# Patient Record
Sex: Male | Born: 1975 | Race: Black or African American | Hispanic: No | Marital: Single | Smoking: Former smoker
Health system: Southern US, Community
[De-identification: ages and names within clinical notes are randomized; demographics above are authoritative.]

## PROBLEM LIST (undated history)

## (undated) DIAGNOSIS — I10 Essential (primary) hypertension: Secondary | ICD-10-CM

## (undated) DIAGNOSIS — F909 Attention-deficit hyperactivity disorder, unspecified type: Secondary | ICD-10-CM

## (undated) DIAGNOSIS — K219 Gastro-esophageal reflux disease without esophagitis: Secondary | ICD-10-CM

## (undated) DIAGNOSIS — F419 Anxiety disorder, unspecified: Secondary | ICD-10-CM

## (undated) DIAGNOSIS — M1990 Unspecified osteoarthritis, unspecified site: Secondary | ICD-10-CM

## (undated) DIAGNOSIS — R519 Headache, unspecified: Secondary | ICD-10-CM

## (undated) HISTORY — PX: TONSILLECTOMY: SUR1361

## (undated) HISTORY — PX: COLONOSCOPY: SHX174

---
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
## 1998-02-25 ENCOUNTER — Emergency Department (HOSPITAL_COMMUNITY): Admission: EM | Admit: 1998-02-25 | Discharge: 1998-02-25 | Payer: Self-pay | Admitting: Internal Medicine

## 2003-06-25 ENCOUNTER — Inpatient Hospital Stay (HOSPITAL_COMMUNITY): Admission: EM | Admit: 2003-06-25 | Discharge: 2003-06-26 | Payer: Self-pay

## 2008-08-04 ENCOUNTER — Emergency Department (HOSPITAL_COMMUNITY): Admission: EM | Admit: 2008-08-04 | Discharge: 2008-08-04 | Payer: Self-pay | Admitting: Emergency Medicine

## 2009-11-08 ENCOUNTER — Emergency Department (HOSPITAL_COMMUNITY): Admission: EM | Admit: 2009-11-08 | Discharge: 2009-11-08 | Payer: Self-pay | Admitting: Emergency Medicine
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
## 2011-06-11 ENCOUNTER — Emergency Department (HOSPITAL_COMMUNITY): Payer: Medicaid Other

## 2011-06-11 ENCOUNTER — Emergency Department (HOSPITAL_COMMUNITY)
Admission: EM | Admit: 2011-06-11 | Discharge: 2011-06-12 | Disposition: A | Discharge status: Home / Self Care | Payer: Medicaid Other | Attending: Emergency Medicine | Admitting: Emergency Medicine

## 2011-06-11 DIAGNOSIS — R109 Unspecified abdominal pain: Secondary | ICD-10-CM | POA: Insufficient documentation

## 2011-06-11 DIAGNOSIS — N453 Epididymo-orchitis: Secondary | ICD-10-CM | POA: Insufficient documentation

## 2011-06-11 DIAGNOSIS — M545 Low back pain, unspecified: Secondary | ICD-10-CM | POA: Insufficient documentation

## 2011-06-11 LAB — BASIC METABOLIC PANEL
BUN: 8 mg/dL (ref 6–23)
CO2: 24 mEq/L (ref 19–32)
Calcium: 9.1 mg/dL (ref 8.4–10.5)
Chloride: 98 mEq/L (ref 96–112)
Creatinine, Ser: 0.73 mg/dL (ref 0.50–1.35)
GFR calc Af Amer: >90 mL/min (ref >90)
GFR calc non Af Amer: >90 mL/min (ref >90)
Glucose, Bld: 113 mg/dL — ABNORMAL HIGH (ref 70–99)
Potassium: 3.1 mEq/L — ABNORMAL LOW (ref 3.5–5.1)
Sodium: 135 mEq/L (ref 135–145)

## 2011-06-11 LAB — CBC
HCT: 40 % (ref 39.0–52.0)
Hemoglobin: 13.6 g/dL (ref 13.0–17.0)
MCH: 29.4 pg (ref 26.0–34.0)
MCHC: 34 g/dL (ref 30.0–36.0)
MCV: 86.4 fL (ref 78.0–100.0)
Platelets: 214 10*3/uL (ref 150–400)
RBC: 4.63 MIL/uL (ref 4.22–5.81)
RDW: 13.9 % (ref 11.5–15.5)
WBC: 19.7 10*3/uL — ABNORMAL HIGH (ref 4.0–10.5)

## 2011-06-11 LAB — DIFFERENTIAL
Basophils Absolute: 0 10*3/uL (ref 0.0–0.1)
Basophils Relative: 0 % (ref 0–1)
Eosinophils Absolute: 0.1 10*3/uL (ref 0.0–0.7)
Eosinophils Relative: 1 % (ref 0–5)
Lymphocytes Relative: 12 % (ref 12–46)
Lymphs Abs: 2.4 10*3/uL (ref 0.7–4.0)
Monocytes Absolute: 1.7 10*3/uL — ABNORMAL HIGH (ref 0.1–1.0)
Monocytes Relative: 9 % (ref 3–12)
Neutro Abs: 15.5 10*3/uL — ABNORMAL HIGH (ref 1.7–7.7)
Neutrophils Relative %: 79 % — ABNORMAL HIGH (ref 43–77)

## 2011-06-12 LAB — URINALYSIS, ROUTINE W REFLEX MICROSCOPIC
Bilirubin Urine: NEGATIVE
Glucose, UA: NEGATIVE mg/dL
Ketones, ur: 15 mg/dL — AB
Nitrite: NEGATIVE
Protein, ur: NEGATIVE mg/dL
Specific Gravity, Urine: 1.006 (ref 1.005–1.030)
Urobilinogen, UA: 0.2 mg/dL (ref 0.0–1.0)
pH: 6 (ref 5.0–8.0)

## 2011-06-12 LAB — URINE MICROSCOPIC-ADD ON

## 2013-02-10 ENCOUNTER — Emergency Department (HOSPITAL_COMMUNITY): Payer: Self-pay

## 2013-02-10 ENCOUNTER — Emergency Department (HOSPITAL_COMMUNITY)
Admission: EM | Admit: 2013-02-10 | Discharge: 2013-02-10 | Disposition: A | Discharge status: Home / Self Care | Payer: Self-pay | Attending: Emergency Medicine | Admitting: Emergency Medicine

## 2013-02-10 ENCOUNTER — Encounter (HOSPITAL_COMMUNITY): Payer: Self-pay | Admitting: *Deleted

## 2013-02-10 DIAGNOSIS — Y9389 Activity, other specified: Secondary | ICD-10-CM | POA: Insufficient documentation

## 2013-02-10 DIAGNOSIS — F17200 Nicotine dependence, unspecified, uncomplicated: Secondary | ICD-10-CM | POA: Insufficient documentation

## 2013-02-10 DIAGNOSIS — S139XXA Sprain of joints and ligaments of unspecified parts of neck, initial encounter: Secondary | ICD-10-CM | POA: Insufficient documentation

## 2013-02-10 DIAGNOSIS — S239XXA Sprain of unspecified parts of thorax, initial encounter: Secondary | ICD-10-CM | POA: Insufficient documentation

## 2013-02-10 DIAGNOSIS — Z880 Allergy status to penicillin: Secondary | ICD-10-CM | POA: Insufficient documentation

## 2013-02-10 DIAGNOSIS — Y92410 Unspecified street and highway as the place of occurrence of the external cause: Secondary | ICD-10-CM | POA: Insufficient documentation

## 2013-02-10 MED ORDER — TRAMADOL HCL 50 MG PO TABS: 50.0000 mg | ORAL_TABLET | Freq: Four times a day (QID) | ORAL | Status: DC | PRN

## 2013-02-10 MED ORDER — IBUPROFEN 600 MG PO TABS: 600.0000 mg | ORAL_TABLET | Freq: Four times a day (QID) | ORAL | Status: DC | PRN
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
## 2013-08-19 DIAGNOSIS — A159 Respiratory tuberculosis unspecified: Secondary | ICD-10-CM

## 2013-08-19 HISTORY — DX: Respiratory tuberculosis unspecified: A15.9

## 2013-09-25 ENCOUNTER — Encounter (HOSPITAL_COMMUNITY): Payer: Self-pay | Admitting: Emergency Medicine

## 2013-09-25 ENCOUNTER — Emergency Department (HOSPITAL_COMMUNITY)
Admission: EM | Admit: 2013-09-25 | Discharge: 2013-09-25 | Disposition: A | Discharge status: Home / Self Care | Payer: No Typology Code available for payment source | Attending: Emergency Medicine | Admitting: Emergency Medicine

## 2013-09-25 DIAGNOSIS — Z880 Allergy status to penicillin: Secondary | ICD-10-CM | POA: Insufficient documentation

## 2013-09-25 DIAGNOSIS — H209 Unspecified iridocyclitis: Secondary | ICD-10-CM | POA: Insufficient documentation

## 2013-09-25 DIAGNOSIS — F17200 Nicotine dependence, unspecified, uncomplicated: Secondary | ICD-10-CM | POA: Insufficient documentation

## 2013-09-25 MED ORDER — OXYCODONE-ACETAMINOPHEN 5-325 MG PO TABS: 1.0000 | ORAL_TABLET | ORAL | Status: DC | PRN

## 2013-09-25 MED ORDER — FLUORESCEIN SODIUM 1 MG OP STRP
1.0000 | ORAL_STRIP | Freq: Once | OPHTHALMIC | Status: AC
  Administered 2013-09-25: 1 via OPHTHALMIC
  Filled 2013-09-25: qty 1

## 2013-09-25 MED ORDER — TETRACAINE HCL 0.5 % OP SOLN
2.0000 [drp] | Freq: Once | OPHTHALMIC | Status: AC
  Administered 2013-09-25: 2 [drp] via OPHTHALMIC
  Filled 2013-09-25: qty 2

## 2013-09-25 MED ORDER — IBUPROFEN 800 MG PO TABS: 800.0000 mg | ORAL_TABLET | Freq: Three times a day (TID) | ORAL | Status: DC

## 2013-09-25 MED ORDER — OXYCODONE-ACETAMINOPHEN 5-325 MG PO TABS
1.0000 | ORAL_TABLET | Freq: Once | ORAL | Status: AC
  Administered 2013-09-25: 1 via ORAL
  Filled 2013-09-25: qty 1

## 2013-09-25 MED ORDER — TOBRAMYCIN-DEXAMETHASONE 0.3-0.1 % OP SUSP
2.0000 [drp] | Freq: Four times a day (QID) | OPHTHALMIC | Status: DC
  Administered 2013-09-25: 2 [drp] via OPHTHALMIC
  Filled 2013-09-25: qty 2.5

## 2013-09-25 NOTE — ED Notes (Signed)
Woke this am with R eye pain and burning, denies any injuries to his eye, tried eye drops and clear eyes with no relief.

## 2013-09-25 NOTE — Discharge Instructions (Signed)
Take ibuprofen for pain. Percocet for severe pain. Apply tobradex drops, 2 drops every 6 hrs for 4 days. If not improving by next week follow up with Dr. Allyne GeeSanders.    Iritis Iritis is an inflammation of the colored part of the eye (iris). Other parts at the front of the eye may also be inflamed. The iris is part of the middle layer of the eyeball which is called the uvea or the uveal track. Any part of the uveal track can become inflamed. The other portions of the uveal track are the choroid (the thin membrane under the outer layer of the eye), and the ciliary body (joins the choroid and the iris and produces the fluid in the front of the eye).  It is extremely important to treat iritis early, as it may lead to internal eye damage causing scarring or diseases such as glaucoma. Some people have only one attack of iritis (in one or both eyes) in their lifetime, while others may get it many times. CAUSES Iritis can be associated with many different diseases, but mostly occurs in otherwise healthy people. Examples of diseases that can be associated with iritis include:  Diseases where the body's immune system attacks tissues within your own body (autoimmune diseases).  Infections (tuberculosis, gonorrhea, fungus infections, Lyme disease, infection of the lining of the heart).  Trauma or injury.  Eye diseases (acute glaucoma and others).  Inflammation from other parts of the uveal track.  Severe eye infections.  Other rare diseases. SYMPTOMS  Eye pain or aching.  Sensitivity to light.  Loss of sight or blurred vision.  Redness of the eye. This is often accompanied by a ring of redness around the outside of the cornea, or clear covering at the front of the eye (ciliary flush).  Excessive tearing of the eye(s).  A small pupil that does not enlarge in the dark and stays smaller than the other eye's pupil.  A whitish area that obscures the lower part of the colored circular iris. Sometimes  this is visible when looking at the eye, where the whitish area has a "fluid level" or flat top. This is called a "hypopyon" and is actually pus inside the eye. Since iritis causes the eye to become red, it is often confused with a much less dangerous form of "pink eye" or conjunctivitis. One of the most important symptoms is sensitivity to light. Anytime there is redness, discomfort in the eye(s) and extreme light sensitivity, it is extremely important to see an ophthalmologist as soon as possible. TREATMENT Acute iritis requires prompt medical evaluation by an eye specialist (ophthalmologist.) Treatment depends on the underlying cause but may include:  Corticosteroid eye drops and dilating eye drops. Follow your caregiver's exact instructions on taking and stopping corticosteroid medications (drops or pills).  Occasionally, the iritis will be so severe that it will not respond to commonly used medications. If this happens, it may be necessary to use steroid injections. The injections are given under the eye's outer surface. Sometimes oral medications are given. The decision on treatment used for iritis is usually made on an individual basis. HOME CARE INSTRUCTIONS Your care giver will give specific instructions regarding the use of eye medications or other medications. Be certain to follow all instructions in both taking and stopping the medications. SEEK IMMEDIATE MEDICAL CARE IF:  You have redness of one or both eye.  You experience a great deal of light sensitivity.  You have pain or aching in either eye. MAKE SURE  YOU:   Understand these instructions.  Will watch your condition.  Will get help right away if you are not doing well or get worse. Document Released: 08/05/2005 Document Revised: 10/28/2011 Document Reviewed: 01/23/2007 [facility] Patient Information 2014 [place], [place].

## 2013-09-25 NOTE — ED Provider Notes (Signed)
CSN: [id number]     Arrival date & time 09/25/13  1332 History  This chart was scribed for Jaynie Crumble, PA working with Flint Melter, MD by Quintella Reichert, ED Scribe. This patient was seen in room TR04C/TR04C and the patient's care was started at 2:32 PM.   Chief Complaint  Patient presents with  . Eye Pain    The history is provided by the patient. No language interpreter was used.    HPI Comments: [name] is a 38 y.o. male who presents to the Emergency Department complaining of severe burning right eye pain that began on waking this morning.  Pt also reports associated blurred vision in that eye.  He has been holding a cold rag on the eye.  He denies previous h/o eye issues.  He does not wear contacts or glasses.  He works at a [employer].   History reviewed. No pertinent past medical history.  History reviewed. No pertinent past surgical history.  History reviewed. No pertinent family history.   History  Substance Use Topics  . Smoking status: Current Every Day Smoker -- 1.00 packs/day    Types: Cigarettes  . Smokeless tobacco: Not on file  . Alcohol Use: Yes     Review of Systems  Constitutional: Negative for fever.  Eyes: Positive for pain and visual disturbance.     Allergies  Codeine and Penicillins  Home Medications   Current Outpatient Rx  Name  Route  Sig  Dispense  Refill  . ibuprofen (ADVIL,MOTRIN) 600 MG tablet   Oral   Take 1 tablet (600 mg total) by mouth every 6 (six) hours as needed for pain.   30 tablet   0   . traMADol (ULTRAM) 50 MG tablet   Oral   Take 1 tablet (50 mg total) by mouth every 6 (six) hours as needed for pain.   15 tablet   0    BP 157/96  Pulse 93  Temp(Src) 97.9 F (36.6 C) (Oral)  Resp 18  SpO2 96%  Physical Exam  Nursing note and vitals reviewed. Constitutional: He is oriented to person, place, and time. He appears well-developed and well-nourished. No distress.  HENT:  Head:  Normocephalic and atraumatic.  Eyes: EOM are normal. Pupils are equal, round, and reactive to light. Right eye exhibits no discharge, no exudate and no hordeolum. No foreign body present in the right eye. Left eye exhibits no discharge, no exudate and no hordeolum. No foreign body present in the left eye. Right conjunctiva is injected. Right conjunctiva has no hemorrhage. Left conjunctiva is not injected. Left conjunctiva has no hemorrhage.  Slit lamp exam:      The right eye shows no corneal abrasion, no corneal ulcer, no foreign body, no hyphema, no hypopyon and no fluorescein uptake.       The left eye shows no corneal abrasion, no corneal ulcer, no foreign body, no hyphema and no hypopyon.  Right conjunctiva injected.   Neck: Neck supple. No tracheal deviation present.  Cardiovascular: Normal rate.   Pulmonary/Chest: Effort normal. No respiratory distress.  Musculoskeletal: Normal range of motion.  Neurological: He is alert and oriented to person, place, and time.  Skin: Skin is warm and dry.  Psychiatric: He has a normal mood and affect. His behavior is normal.    ED Course  Procedures (including critical care time)  DIAGNOSTIC STUDIES: Oxygen Saturation is 96% on room air, normal by my interpretation.    COORDINATION OF CARE: 2:35  PM-Discussed treatment plan which includes fluorescein exam with pt at bedside and pt agreed to plan.     Labs Review Labs Reviewed - No data to display  Imaging Review No results found.  EKG Interpretation   None       MDM   1. Iritis     Visual acuity L 20/20, R 20/100. Pressures 16. Pt is pacing in the room in pain. PERRLA, right eye conjunctiva is injected. Photophobia in right eye only, no consensual photophobia. Fluorescein stain negative. Pressure normal. No hyphema or hypopyon. Will get opthalmology involved given decrease in vision and pain.    3:31 PM Discussed with Dr. Allyne GeeSanders, ophthalmology. Advised tobradex drops QID, follow  up with him next week if not improving.   Filed Vitals:   09/25/13 1339  BP: 157/96  Pulse: 93  Temp: 97.9 F (36.6 C)  TempSrc: Oral  Resp: 18  SpO2: 96%     I personally performed the services described in this documentation, which was scribed in my presence. The recorded information has been reviewed and is accurate.    Lottie Musselatyana A Kelvis Berger, PA-C 09/25/13 1538

## 2013-09-25 NOTE — ED Provider Notes (Signed)
Medical screening examination/treatment/procedure(s) were performed by non-physician practitioner and as supervising physician I was immediately available for consultation/collaboration.  Spero Gunnels L Romelle Muldoon, MD 09/25/13 1604 

## 2014-04-16 ENCOUNTER — Emergency Department (HOSPITAL_COMMUNITY)
Admission: EM | Admit: 2014-04-16 | Discharge: 2014-04-16 | Disposition: A | Discharge status: Home / Self Care | Payer: Medicaid Other | Attending: Emergency Medicine | Admitting: Emergency Medicine

## 2014-04-16 ENCOUNTER — Encounter (HOSPITAL_COMMUNITY): Payer: Self-pay | Admitting: Emergency Medicine

## 2014-04-16 ENCOUNTER — Emergency Department (HOSPITAL_COMMUNITY): Payer: Medicaid Other

## 2014-04-16 DIAGNOSIS — Z79899 Other long term (current) drug therapy: Secondary | ICD-10-CM | POA: Insufficient documentation

## 2014-04-16 DIAGNOSIS — S62309A Unspecified fracture of unspecified metacarpal bone, initial encounter for closed fracture: Secondary | ICD-10-CM | POA: Insufficient documentation

## 2014-04-16 DIAGNOSIS — F17200 Nicotine dependence, unspecified, uncomplicated: Secondary | ICD-10-CM | POA: Insufficient documentation

## 2014-04-16 DIAGNOSIS — Z880 Allergy status to penicillin: Secondary | ICD-10-CM | POA: Insufficient documentation

## 2014-04-16 DIAGNOSIS — S6292XA Unspecified fracture of left wrist and hand, initial encounter for closed fracture: Secondary | ICD-10-CM

## 2014-04-16 DIAGNOSIS — S6990XA Unspecified injury of unspecified wrist, hand and finger(s), initial encounter: Secondary | ICD-10-CM | POA: Insufficient documentation

## 2014-04-16 MED ORDER — NAPROXEN 500 MG PO TABS: 500.0000 mg | ORAL_TABLET | Freq: Two times a day (BID) | ORAL | Status: DC

## 2014-04-16 MED ORDER — HYDROCODONE-ACETAMINOPHEN 5-325 MG PO TABS: 1.0000 | ORAL_TABLET | ORAL | Status: DC | PRN

## 2014-04-16 MED ORDER — HYDROCODONE-ACETAMINOPHEN 5-325 MG PO TABS
2.0000 | ORAL_TABLET | Freq: Once | ORAL | Status: DC
  Filled 2014-04-16: qty 2

## 2014-04-16 MED ORDER — HYDROCODONE-ACETAMINOPHEN 5-325 MG PO TABS
2.0000 | ORAL_TABLET | Freq: Once | ORAL | Status: AC
Start: 2014-04-16 — End: 2014-04-16
  Administered 2014-04-16: 2 via ORAL
  Filled 2014-04-16: qty 2
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
## 2014-04-16 NOTE — Discharge Instructions (Signed)
Your X-rays show that you have broken bones from your injury. Use rest, ice, compression elevation to reduce pain and swelling in your hand. Followup with a primary care provider or to orthopedic specialist for continued evaluation and treatment.    Hand Fracture, Metacarpals Fractures of metacarpals are breaks in the bones of the hand. They extend from the knuckles to the wrist. These bones can undergo many types of fractures. There are different ways of treating these fractures, all of which may be correct. TREATMENT  Hand fractures can be treated with:   Non-reduction - The fracture is casted without changing the positions of the fracture (bone pieces) involved. This fracture is usually left in a cast for 4 to 6 weeks or as your caregiver thinks necessary.  Closed reduction - The bones are moved back into position without surgery and then casted.  ORIF (open reduction and internal fixation) - The fracture site is opened and the bone pieces are fixed into place with some type of hardware, such as screws, etc. They are then casted. Your caregiver will discuss the type of fracture you have and the treatment that should be best for that problem. If surgery is chosen, let your caregivers know about the following.  LET YOUR CAREGIVERS KNOW ABOUT:  Allergies.  Medications you are taking, including herbs, eye drops, over the counter medications, and creams.  Use of steroids (by mouth or creams).  Previous problems with anesthetics or novocaine.  Possibility of pregnancy.  History of blood clots (thrombophlebitis).  History of bleeding or blood problems.  Previous surgeries.  Other health problems. AFTER THE PROCEDURE After surgery, you will be taken to the recovery area where a nurse will watch and check your progress. Once you are awake, stable, and taking fluids well, barring other problems, you'll be allowed to go home. Once home, an ice pack applied to your operative site may help  with pain and keep the swelling down. HOME CARE INSTRUCTIONS   Follow your caregiver's instructions as to activities, exercises, physical therapy, and driving a car.  Daily exercise is helpful for keeping range of motion and strength. Exercise as instructed.  To lessen swelling, keep the injured hand elevated above the level of your heart as much as possible.  Apply ice to the injury for 15-20 minutes each hour while awake for the first 2 days. Put the ice in a plastic bag and place a thin towel between the bag of ice and your cast.  Move the fingers of your casted hand several times a day.  If a plaster or fiberglass cast was applied:  Do not try to scratch the skin under the cast using a sharp or pointed object.  Check the skin around the cast every day. You may put lotion on red or sore areas.  Keep your cast dry. Your cast can be protected during bathing with a plastic bag. Do not put your cast into the water.  If a plaster splint was applied:  Wear your splint for as long as directed by your caregiver or until seen again.  Do not get your splint wet. Protect it during bathing with a plastic bag.  You may loosen the elastic bandage around the splint if your fingers start to get numb, tingle, get cold or turn blue.  Do not put pressure on your cast or splint; this may cause it to break. Especially, do not lean plaster casts on hard surfaces for 24 hours after application.  Take medications as  directed by your caregiver.  Only take over-the-counter or prescription medicines for pain, discomfort, or fever as directed by your caregiver.  Follow-up as provided by your caregiver. This is very important in order to avoid permanent injury or disability and chronic pain. SEEK MEDICAL CARE IF:   Increased bleeding (more than a small spot) from beneath your cast or splint if there is beneath the cast as with an open reduction.  Redness, swelling, or increasing pain in the wound or  from beneath your cast or splint.  Pus coming from wound or from beneath your cast or splint.  An unexplained oral temperature above 102 F (38.9 C) develops, or as your caregiver suggests.  A foul smell coming from the wound or dressing or from beneath your cast or splint.  You have a problem moving any of your fingers. SEEK IMMEDIATE MEDICAL CARE IF:   You develop a rash  You have difficulty breathing  You have any allergy problems If you do not have a window in your cast for observing the wound, a discharge or minor bleeding may show up as a stain on the outside of your cast. Report these findings to your caregiver. MAKE SURE YOU:   Understand these instructions.  Will watch your condition.  Will get help right away if you are not doing well or get worse. Document Released: 08/05/2005 Document Revised: 10/28/2011 Document Reviewed: 03/24/2008 [facility] Patient Information 2015 [place], [place]. This information is not intended to replace advice given to you by your health care provider. Make sure you discuss any questions you have with your health care provider.
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
## 2014-04-25 NOTE — ED Provider Notes (Signed)
Medical screening examination/treatment/procedure(s) were performed by non-physician practitioner and as supervising physician I was immediately available for consultation/collaboration.   EKG Interpretation None       [name]. Rubin Payor, MD 04/25/14 1441

## 2014-10-18 ENCOUNTER — Encounter (HOSPITAL_COMMUNITY): Payer: Self-pay | Admitting: Emergency Medicine

## 2014-10-18 ENCOUNTER — Emergency Department (HOSPITAL_COMMUNITY)
Admission: EM | Admit: 2014-10-18 | Discharge: 2014-10-18 | Disposition: A | Discharge status: Home / Self Care | Payer: Medicaid Other | Attending: Emergency Medicine | Admitting: Emergency Medicine

## 2014-10-18 ENCOUNTER — Emergency Department (HOSPITAL_COMMUNITY): Payer: Medicaid Other

## 2014-10-18 DIAGNOSIS — Z880 Allergy status to penicillin: Secondary | ICD-10-CM | POA: Insufficient documentation

## 2014-10-18 DIAGNOSIS — W2201XA Walked into wall, initial encounter: Secondary | ICD-10-CM | POA: Insufficient documentation

## 2014-10-18 DIAGNOSIS — S62306A Unspecified fracture of fifth metacarpal bone, right hand, initial encounter for closed fracture: Secondary | ICD-10-CM | POA: Insufficient documentation

## 2014-10-18 DIAGNOSIS — Z791 Long term (current) use of non-steroidal anti-inflammatories (NSAID): Secondary | ICD-10-CM | POA: Insufficient documentation

## 2014-10-18 DIAGNOSIS — S60041A Contusion of right ring finger without damage to nail, initial encounter: Secondary | ICD-10-CM | POA: Insufficient documentation

## 2014-10-18 DIAGNOSIS — Y9389 Activity, other specified: Secondary | ICD-10-CM | POA: Insufficient documentation

## 2014-10-18 DIAGNOSIS — Z720 Tobacco use: Secondary | ICD-10-CM | POA: Insufficient documentation

## 2014-10-18 DIAGNOSIS — Y998 Other external cause status: Secondary | ICD-10-CM | POA: Insufficient documentation

## 2014-10-18 DIAGNOSIS — Y9289 Other specified places as the place of occurrence of the external cause: Secondary | ICD-10-CM | POA: Insufficient documentation

## 2014-10-18 DIAGNOSIS — Z23 Encounter for immunization: Secondary | ICD-10-CM | POA: Insufficient documentation

## 2014-10-18 DIAGNOSIS — S6291XA Unspecified fracture of right wrist and hand, initial encounter for closed fracture: Secondary | ICD-10-CM

## 2014-10-18 MED ORDER — TETANUS-DIPHTH-ACELL PERTUSSIS 5-2.5-18.5 LF-MCG/0.5 IM SUSP
0.5000 mL | Freq: Once | INTRAMUSCULAR | Status: AC
  Administered 2014-10-18: 0.5 mL via INTRAMUSCULAR
  Filled 2014-10-18: qty 0.5

## 2014-10-18 MED ORDER — LIDOCAINE HCL (PF) 1 % IJ SOLN
30.0000 mL | Freq: Once | INTRAMUSCULAR | Status: DC
  Filled 2014-10-18: qty 30

## 2014-10-18 MED ORDER — OXYCODONE-ACETAMINOPHEN 5-325 MG PO TABS: 1.0000 | ORAL_TABLET | Freq: Four times a day (QID) | ORAL | Status: DC | PRN

## 2014-10-18 MED ORDER — KETOROLAC TROMETHAMINE 30 MG/ML IJ SOLN
30.0000 mg | Freq: Once | INTRAMUSCULAR | Status: AC
  Administered 2014-10-18: 30 mg via INTRAMUSCULAR
  Filled 2014-10-18: qty 1

## 2014-10-18 MED ORDER — KETOROLAC TROMETHAMINE 30 MG/ML IJ SOLN: 30.0000 mg | Freq: Once | INTRAMUSCULAR | Status: DC

## 2014-10-18 MED ORDER — HYDROMORPHONE HCL 1 MG/ML IJ SOLN
1.0000 mg | Freq: Once | INTRAMUSCULAR | Status: AC
  Administered 2014-10-18: 1 mg via INTRAMUSCULAR
  Filled 2014-10-18: qty 1

## 2014-10-18 MED ORDER — OXYCODONE-ACETAMINOPHEN 5-325 MG PO TABS
1.0000 | ORAL_TABLET | Freq: Once | ORAL | Status: DC
  Filled 2014-10-18: qty 1
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
## 2014-10-18 NOTE — ED Notes (Signed)
Holding PO meds per PA until consult to hand surgery completed

## 2014-10-18 NOTE — Discharge Instructions (Signed)
Cast or Splint Care  Casts and splints support injured limbs and keep bones from moving while they heal. It is important to care for your cast or splint at home.    HOME CARE INSTRUCTIONS  · Keep the cast or splint uncovered during the drying period. It can take 24 to 48 hours to dry if it is made of plaster. A fiberglass cast will dry in less than 1 hour.  · Do not rest the cast on anything harder than a pillow for the first 24 hours.  · Do not put weight on your injured limb or apply pressure to the cast until your health care provider gives you permission.  · Keep the cast or splint dry. Wet casts or splints can lose their shape and may not support the limb as well. A wet cast that has lost its shape can also create harmful pressure on your skin when it dries. Also, wet skin can become infected.  ¨ Cover the cast or splint with a plastic bag when bathing or when out in the rain or snow. If the cast is on the trunk of the body, take sponge baths until the cast is removed.  ¨ If your cast does become wet, dry it with a towel or a blow dryer on the cool setting only.  · Keep your cast or splint clean. Soiled casts may be wiped with a moistened cloth.  · Do not place any hard or soft foreign objects under your cast or splint, such as cotton, toilet paper, lotion, or powder.  · Do not try to scratch the skin under the cast with any object. The object could get stuck inside the cast. Also, scratching could lead to an infection. If itching is a problem, use a blow dryer on a cool setting to relieve discomfort.  · Do not trim or cut your cast or remove padding from inside of it.  · Exercise all joints next to the injury that are not immobilized by the cast or splint. For example, if you have a long leg cast, exercise the hip joint and toes. If you have an arm cast or splint, exercise the shoulder, elbow, thumb, and fingers.  · Elevate your injured arm or leg on 1 or 2 pillows for the first 1 to 3 days to decrease  swelling and pain. It is best if you can comfortably elevate your cast so it is higher than your heart.  SEEK MEDICAL CARE IF:   · Your cast or splint cracks.  · Your cast or splint is too tight or too loose.  · You have unbearable itching inside the cast.  · Your cast becomes wet or develops a soft spot or area.  · You have a bad smell coming from inside your cast.  · You get an object stuck under your cast.  · Your skin around the cast becomes red or raw.  · You have new pain or worsening pain after the cast has been applied.  SEEK IMMEDIATE MEDICAL CARE IF:   · You have fluid leaking through the cast.  · You are unable to move your fingers or toes.  · You have discolored (blue or white), cool, painful, or very swollen fingers or toes beyond the cast.  · You have tingling or numbness around the injured area.  · You have severe pain or pressure under the cast.  · You have any difficulty with your breathing or have shortness of breath.  · You have chest   pain.  Document Released: 08/02/2000 Document Revised: 05/26/2013 Document Reviewed: 02/11/2013  ExitCare® Patient Information ©2015 [employer]. This information is not intended to replace advice given to you by your health care provider. Make sure you discuss any questions you have with your health care provider.      Hand Fracture, Fifth Metacarpal  The small metacarpal is the bone at the base of the little finger between the knuckle and the wrist. A fracture is a break in that bone. One of the fractures that is common to this bone is called a Boxer's Fracture.  TREATMENT  These fractures can be treated with:   · Reduction (bones moved back into place), then pinned through the skin to maintain the position, and then casted for about 6 weeks or as your caregiver determines necessary.  · ORIF (open reduction and internal fixation) - the fracture site is opened and the bone pieces are fixed into place with pins and then casted for approximately 6 weeks or as your  caregiver determines necessary.  Your caregiver will discuss the type of fracture you have and the treatment that should be best for that problem. If surgery is the treatment of choice, the following is information for you to know, and also let your caregiver know about prior to surgery.   LET YOUR CAREGIVER KNOW ABOUT:  · Allergies.  · Medications taken including herbs, eye drops, over the counter medications, and creams.  · Use of steroids (by mouth or creams).  · Previous problems with anesthetics or novocaine.  · Possibility of pregnancy, if this applies.  · History of blood clots (thrombophlebitis).  · History of bleeding or blood problems.  · Previous surgery.  · Other health problems.  AFTER THE PROCEDURE  After surgery, you will be taken to the recovery area where a nurse will watch and check your progress. Once you're awake, stable, and taking fluids well, barring other problems you'll be allowed to go home. Once home an ice pack applied to your operative site may help with discomfort and keep the swelling down.  HOME CARE INSTRUCTIONS   · Follow your caregiver's instructions as to activities, exercises, physical therapy, and driving a car.  · Daily exercise is helpful for maintaining range of motion (movement and mobility) and strength. Exercise as instructed.  · To lessen swelling, keep the injured hand elevated above the level of your heart as much as possible.  · Apply ice to the injury for 15-20 minutes each hour while awake for the first 2 days. Put the ice in a plastic bag and place a thin towel between the bag of ice and your cast.  · Move the fingers of your casted hand at least several times a day.  · If a plaster or fiberglass cast was applied:  ¨ Do not try to scratch the skin under the cast using a sharp or pointed object.  ¨ Check the skin around the cast every day. You may put lotion on red or sore areas.  ¨ Keep your cast dry. Your cast can be protected during bathing with a plastic bag. Do  not put your cast into the water.  · If a plaster splint was applied:  ¨ Wear the splint for as long as directed by your caregiver or until seen for follow-up examination.  ¨ Do not get your splint wet. Protect it during bathing with a plastic bag.  ¨ You may loosen the elastic bandage around the splint if your fingers   start to get numb, tingle, get cold or turn blue.  · Do not put pressure on your cast or splint; this may cause it to break. Especially, do not lean plaster casts on hard surfaces for 24 hours after application.  · Take medications as directed by your caregiver.  · Only take over-the-counter or prescription medicines for pain, discomfort, or fever as directed by your caregiver.  · Follow all instructions for physician referrals, physical therapy, and rehabilitation. Any delay in obtaining necessary care could result in permanent injury, disability and chronic pain.  SEEK MEDICAL CARE IF:   · Increased bleeding (more than a small spot) from the wound or from beneath your cast or splint if there is a wound beneath the cast from surgery.  · Redness, swelling, or increasing pain in the wound or from beneath your cast or splint.  · Pus coming from wound or from beneath your cast or splint.  · An unexplained oral temperature above 102° F (38.9° C) develops.  · A foul smell coming from the wound or dressing or from beneath your cast or splint.  · You are unable to move your little finger.  SEEK IMMEDIATE MEDICAL CARE IF:   You develop a rash, have difficulty breathing, or have any allergy problems.  If you do not have a window in your cast for observing the wound, a discharge or minor bleeding may show up as a stain on the outside of your cast. Report these findings to your caregiver.  MAKE SURE YOU:   · Understand these instructions.  · Will watch your condition.  · Will get help right away if you are not doing well or get worse.  Document Released: 11/11/2000 Document Revised: 10/28/2011 Document Reviewed:  03/24/2008  ExitCare® Patient Information ©2015 [employer]. This information is not intended to replace advice given to you by your health care provider. Make sure you discuss any questions you have with your health care provider.

## 2019-04-12 ENCOUNTER — Encounter (HOSPITAL_COMMUNITY): Payer: Self-pay

## 2019-04-12 ENCOUNTER — Emergency Department (HOSPITAL_COMMUNITY): Payer: Self-pay

## 2019-04-12 ENCOUNTER — Emergency Department (HOSPITAL_COMMUNITY)
Admission: EM | Admit: 2019-04-12 | Discharge: 2019-04-12 | Disposition: A | Discharge status: Home / Self Care | Payer: Self-pay | Attending: Emergency Medicine | Admitting: Emergency Medicine

## 2019-04-12 ENCOUNTER — Other Ambulatory Visit: Payer: Self-pay

## 2019-04-12 DIAGNOSIS — I10 Essential (primary) hypertension: Secondary | ICD-10-CM | POA: Insufficient documentation

## 2019-04-12 DIAGNOSIS — R911 Solitary pulmonary nodule: Secondary | ICD-10-CM

## 2019-04-12 DIAGNOSIS — R0789 Other chest pain: Secondary | ICD-10-CM

## 2019-04-12 DIAGNOSIS — R079 Chest pain, unspecified: Secondary | ICD-10-CM

## 2019-04-12 DIAGNOSIS — F17210 Nicotine dependence, cigarettes, uncomplicated: Secondary | ICD-10-CM | POA: Insufficient documentation

## 2019-04-12 HISTORY — DX: Essential (primary) hypertension: I10

## 2019-04-12 LAB — CBC WITH DIFFERENTIAL/PLATELET
Abs Immature Granulocytes: 0.04 10*3/uL (ref 0.00–0.07)
Basophils Absolute: 0.1 10*3/uL (ref 0.0–0.1)
Basophils Relative: 1 %
Eosinophils Absolute: 0.2 10*3/uL (ref 0.0–0.5)
Eosinophils Relative: 2 %
HCT: 45.1 % (ref 39.0–52.0)
Hemoglobin: 14.6 g/dL (ref 13.0–17.0)
Immature Granulocytes: 1 %
Lymphocytes Relative: 33 %
Lymphs Abs: 2.8 10*3/uL (ref 0.7–4.0)
MCH: 27.8 pg (ref 26.0–34.0)
MCHC: 32.4 g/dL (ref 30.0–36.0)
MCV: 85.7 fL (ref 80.0–100.0)
Monocytes Absolute: 0.5 10*3/uL (ref 0.1–1.0)
Monocytes Relative: 6 %
Neutro Abs: 4.8 10*3/uL (ref 1.7–7.7)
Neutrophils Relative %: 57 %
Platelets: 201 10*3/uL (ref 150–400)
RBC: 5.26 MIL/uL (ref 4.22–5.81)
RDW: 15.2 % (ref 11.5–15.5)
WBC: 8.4 10*3/uL (ref 4.0–10.5)
nRBC: 0 % (ref 0.0–0.2)

## 2019-04-12 LAB — TROPONIN I (HIGH SENSITIVITY)
Troponin I (High Sensitivity): 3 ng/L (ref <18)
Troponin I (High Sensitivity): <2 ng/L (ref <18)

## 2019-04-12 LAB — COMPREHENSIVE METABOLIC PANEL
ALT: 16 U/L (ref 0–44)
AST: 23 U/L (ref 15–41)
Albumin: 3.9 g/dL (ref 3.5–5.0)
Alkaline Phosphatase: 64 U/L (ref 38–126)
Anion gap: 11 (ref 5–15)
BUN: 7 mg/dL (ref 6–20)
CO2: 24 mmol/L (ref 22–32)
Calcium: 9 mg/dL (ref 8.9–10.3)
Chloride: 102 mmol/L (ref 98–111)
Creatinine, Ser: 0.8 mg/dL (ref 0.61–1.24)
GFR calc Af Amer: >60 mL/min (ref >60)
GFR calc non Af Amer: >60 mL/min (ref >60)
Glucose, Bld: 136 mg/dL — ABNORMAL HIGH (ref 70–99)
Potassium: 3.8 mmol/L (ref 3.5–5.1)
Sodium: 137 mmol/L (ref 135–145)
Total Bilirubin: 0.7 mg/dL (ref 0.3–1.2)
Total Protein: 6.7 g/dL (ref 6.5–8.1)

## 2019-04-12 MED ORDER — HYDROCHLOROTHIAZIDE 12.5 MG PO TABS: 12.5000 mg | ORAL_TABLET | Freq: Every day | ORAL | 0 refills | Status: DC

## 2019-04-12 MED ORDER — AZITHROMYCIN 250 MG PO TABS: 250.0000 mg | ORAL_TABLET | Freq: Every day | ORAL | 0 refills | Status: DC

## 2019-04-12 MED ORDER — IOHEXOL 350 MG/ML SOLN: 80.0000 mL | Freq: Once | INTRAVENOUS | Status: DC | PRN

## 2019-04-12 MED ORDER — NITROGLYCERIN 0.4 MG SL SUBL
0.4000 mg | SUBLINGUAL_TABLET | SUBLINGUAL | Status: DC | PRN
  Administered 2019-04-12 (×3): 0.4 mg via SUBLINGUAL

## 2019-04-12 MED ORDER — KETOROLAC TROMETHAMINE 15 MG/ML IJ SOLN
15.0000 mg | Freq: Once | INTRAMUSCULAR | Status: AC
  Administered 2019-04-12: 15 mg via INTRAVENOUS
  Filled 2019-04-12: qty 1

## 2019-04-12 MED ORDER — FENTANYL CITRATE (PF) 100 MCG/2ML IJ SOLN
50.0000 ug | Freq: Once | INTRAMUSCULAR | Status: AC
  Administered 2019-04-12: 12:00:00 50 ug via INTRAVENOUS
  Filled 2019-04-12: qty 2

## 2019-04-12 MED ORDER — LISINOPRIL 10 MG PO TABS: 10.0000 mg | ORAL_TABLET | Freq: Every day | ORAL | 0 refills | Status: DC

## 2019-04-12 MED ORDER — ASPIRIN 81 MG PO CHEW
324.0000 mg | CHEWABLE_TABLET | Freq: Once | ORAL | Status: AC
  Administered 2019-04-12: 11:00:00 324 mg via ORAL
  Filled 2019-04-12: qty 4

## 2019-04-12 MED ORDER — IOHEXOL 350 MG/ML SOLN
75.0000 mL | Freq: Once | INTRAVENOUS | Status: AC | PRN
  Administered 2019-04-12: 13:00:00 75 mL via INTRAVENOUS

## 2019-04-12 MED ORDER — FENTANYL CITRATE (PF) 100 MCG/2ML IJ SOLN
50.0000 ug | Freq: Once | INTRAMUSCULAR | Status: AC
  Administered 2019-04-12: 12:00:00 50 ug via INTRAVENOUS

## 2019-04-12 NOTE — ED Provider Notes (Signed)
[facility]   CSN: [id number] Arrival date & time: 04/12/19  1052     History   Chief Complaint Chief Complaint  Patient presents with  . Chest Pain    HPI [name] is a 43 y.o. male.     The history is provided by the patient and medical records. No language interpreter was used.  Chest Pain  [name] is a 43 y.o. male who presents to the Emergency Department complaining of chest pain.  He complains of severe, left sided chest pain that began about one hour prior to ED arrival.  Pain radiates to the LUE. Denies any associated symptoms.  No recent illnesses or injuries.  He has a hx/o HTN.  He smokes tobacco, occasional alcohol and marijuana.  He has a family hx/o CAD in two uncles in their 3850s, mother with CHF in 2040s.   Past Medical History:  Diagnosis Date  . Hypertension     There are no active problems to display for this patient.   Past Surgical History:  Procedure Laterality Date  . TONSILLECTOMY          Home Medications    Prior to Admission medications   Medication Sig Start Date End Date Taking? Authorizing Provider  Aspirin-Salicylamide-Caffeine (BC HEADACHE POWDER PO) Take 1 packet by mouth every 6 (six) hours as needed (for pain).    [provider]  azithromycin (ZITHROMAX) 250 MG tablet Take 1 tablet (250 mg total) by mouth daily. Take first 2 tablets together, then 1 every day until finished. 04/12/19   Tilden Fossaees, Shaunice Levitan, MD  hydrochlorothiazide (HYDRODIURIL) 12.5 MG tablet Take 1 tablet (12.5 mg total) by mouth daily. 04/12/19   Tilden Fossaees, Nikodem Leadbetter, MD  HYDROcodone-acetaminophen (NORCO/VICODIN) 5-325 MG per tablet Take 1-2 tablets by mouth every 4 (four) hours as needed for moderate pain. Patient not taking: Reported on 04/12/2019 04/16/14   Ivonne Andrewammen, Peter, PA-C  lisinopril (ZESTRIL) 10 MG tablet Take 1 tablet (10 mg total) by mouth daily. 04/12/19   Tilden Fossaees, Oralee Rapaport, MD  naproxen  (NAPROSYN) 500 MG tablet Take 1 tablet (500 mg total) by mouth 2 (two) times daily. Patient not taking: Reported on 04/12/2019 04/16/14   Ivonne Andrewammen, Peter, PA-C  oxyCODONE-acetaminophen (PERCOCET/ROXICET) 5-325 MG per tablet Take 1 tablet by mouth every 6 (six) hours as needed for severe pain. Patient not taking: Reported on 04/12/2019 10/18/14   Catha GosselinPatel-Mills, Hanna, PA-C    Family History No family history on file.  Social History Social History   Tobacco Use  . Smoking status: Current Every Day Smoker    Packs/day: 1.00    Types: Cigarettes  Substance Use Topics  . Alcohol use: Yes    Comment: occ  . Drug use: No     Allergies   Codeine and Penicillins   Review of Systems Review of Systems  Cardiovascular: Positive for chest pain.  All other systems reviewed and are negative.    Physical Exam Updated Vital Signs BP (!) 142/79   Pulse (!) 50   Temp 97.9 F (36.6 C)   Resp 10   Ht 5\' 10"  (1.778 m)   Wt 81.6 kg   SpO2 99%   BMI 25.83 kg/m   Physical Exam Vitals signs and nursing note reviewed.  Constitutional:      Appearance: He is well-developed.     Comments: Uncomfortable appearing.    HENT:     Head: Normocephalic and atraumatic.  Neck:  Musculoskeletal: Neck supple. No muscular tenderness.  Cardiovascular:     Rate and Rhythm: Normal rate and regular rhythm.     Heart sounds: No murmur.  Pulmonary:     Effort: Pulmonary effort is normal. No respiratory distress.     Breath sounds: Normal breath sounds.  Chest:     Chest wall: No tenderness.  Abdominal:     Palpations: Abdomen is soft.     Tenderness: There is no abdominal tenderness. There is no guarding or rebound.  Musculoskeletal:        General: No tenderness.     Comments: 2+ radial and DP pulses bilaterally  Skin:    General: Skin is warm and dry.  Neurological:     Mental Status: He is alert and oriented to person, place, and time.  Psychiatric:        Behavior: Behavior normal.       ED Treatments / Results  Labs (all labs ordered are listed, but only abnormal results are displayed) Labs Reviewed  COMPREHENSIVE METABOLIC PANEL - Abnormal; Notable for the following components:      Result Value   Glucose, Bld 136 (*)    All other components within normal limits  CBC WITH DIFFERENTIAL/PLATELET  TROPONIN I (HIGH SENSITIVITY)  TROPONIN I (HIGH SENSITIVITY)    EKG EKG Interpretation  Date/Time:  Monday April 12 2019 13:02:11 EDT Ventricular Rate:  62 PR Interval:    QRS Duration: 112 QT Interval:  461 QTC Calculation: 469 R Axis:   91 Text Interpretation:  Sinus rhythm Prolonged PR interval Borderline intraventricular conduction delay Nonspecific T abnrm, anterolateral leads ST elev, probable normal early repol pattern Confirmed by Quintella Reichert [phone]) on 04/12/2019 1:07:05 PM   Radiology Dg Chest Port 1 View  Result Date: 04/12/2019 CLINICAL DATA:  Pt presents for left sided CP that radiates down his left arm. Pt denies SOB, NVD. Skin warm and dry, pt denies cardiac history. States pain is constant. Hx HTN EXAM: PORTABLE CHEST 1 VIEW COMPARISON:  Chest radiograph dated 02/10/2013 FINDINGS: The heart size and mediastinal contours are within normal limits. The lungs are clear. No pneumothorax or pleural effusion. The visualized skeletal structures are unremarkable. IMPRESSION: No acute cardiopulmonary process. Electronically Signed   By: Audie Pinto M.D.   On: 04/12/2019 12:20   Ct Angio Chest Aorta W/cm &/or Wo/cm  Result Date: 04/12/2019 CLINICAL DATA:  Chest and back pain, aortic dissection suspected. Radiating chest pain to left arm. EXAM: CT ANGIOGRAPHY CHEST WITH CONTRAST TECHNIQUE: Multidetector CT imaging of the chest was performed using the standard protocol during bolus administration of intravenous contrast. Multiplanar CT image reconstructions and MIPs were obtained to evaluate the vascular anatomy. CONTRAST:  35mL OMNIPAQUE IOHEXOL 350 MG/ML  SOLN COMPARISON:  Same-day radiograph April 12, 2019 FINDINGS: Cardiovascular: Noncontrast CT images chest demonstrate few scattered atheromatous plaque without plaque displacement or hyperattenuating mural thickening to suggest intramural hematoma. Post contrast CT images were obtained. The aortic root is suboptimally assessed given cardiac pulsation artifact. The aorta is normal caliber. No intramural hematoma, dissection flap or other luminal abnormality of the aorta is seen. No periaortic stranding or hemorrhage. There is a shared origin of the brachiocephalic and left common carotid artery. Great vessels and proximal subclavian arteries are unremarkable Normal heart size. No pericardial effusion. Pulmonary arteries are normal caliber. No large central filling defects though study is not tailored for evaluation of pulmonary embolus. Mediastinum/Nodes: Wedge-shaped soft tissue attenuation in the pre-vascular space has an appearance  most consistent with partial thymic remnant. No mediastinal hematoma. No enlarged mediastinal or axillary lymph nodes. Thyroid gland, trachea, and esophagus demonstrate no significant findings. Lungs/Pleura: There is a single part solid nodule in the anterior segment left upper lobe (6/21) No consolidation, features of edema, pneumothorax, or effusion. Dependent atelectasis posteriorly. Upper Abdomen: No acute abnormalities present in the visualized portions of the upper abdomen. Musculoskeletal: Mild bilateral gynecomastia. No other chest wall abnormality. No acute or significant osseous findings. Review of the MIP images confirms the above findings. IMPRESSION: 1. No evidence of acute aortic syndrome. 2. No acute intrathoracic process. 3.  Aortic Atherosclerosis (ICD10-I70.0).  Can not be 4. Single part solid nodule in the anterior segment left upper lobe. Likely infectious or inflammatory. Follow-up non-contrast CT recommended at 3-6 months to confirm persistence. If unchanged, and  solid component remains <6 mm, annual CT is recommended until 5 years of stability has been established. If persistent these nodules should be considered highly suspicious if the solid component of the nodule is 6 mm or greater in size and enlarging. This recommendation follows the consensus statement: Guidelines for Management of Incidental Pulmonary Nodules Detected on CT Images: From the Fleischner Society 2017; Radiology 2017; 284:228-243. Electronically Signed   By: Kreg ShropshirePrice  DeHay M.D.   On: 04/12/2019 13:39    Procedures Procedures (including critical care time)  Medications Ordered in ED Medications  nitroGLYCERIN (NITROSTAT) SL tablet 0.4 mg (0.4 mg Sublingual Given 04/12/19 1138)  aspirin chewable tablet 324 mg (324 mg Oral Given 04/12/19 1129)  fentaNYL (SUBLIMAZE) injection 50 mcg (50 mcg Intravenous Given 04/12/19 1140)  fentaNYL (SUBLIMAZE) injection 50 mcg (50 mcg Intravenous Given 04/12/19 1227)  iohexol (OMNIPAQUE) 350 MG/ML injection 75 mL (75 mLs Intravenous Contrast Given 04/12/19 1325)  ketorolac (TORADOL) 15 MG/ML injection 15 mg (15 mg Intravenous Given 04/12/19 1440)     Initial Impression / Assessment and Plan / ED Course  I have reviewed the triage vital signs and the nursing notes.  Pertinent labs & imaging results that were available during my care of the patient were reviewed by me and considered in my medical decision making (see chart for details).        Patient here for evaluation of left sided chest pain. EKG without acute ischemic changes. Troponin is negative times two. Given nature of pain a CTA was obtained, which was negative for dissection. Presentation is not consistent with ACS, here score of two. Discussed with patient home care for chest pain, atypical. Discussed possible musculoskeletal pain. Discussed outpatient follow-up and return precautions. CT scan with pulmonary nodule, possible infectious process, will treat for possible community acquired  pneumonia. Discussed with patient finding of pulmonary nodules.    Final Clinical Impressions(s) / ED Diagnoses   Final diagnoses:  Atypical chest pain  Pulmonary nodule    ED Discharge Orders         Ordered    lisinopril (ZESTRIL) 10 MG tablet  Daily     04/12/19 1437    hydrochlorothiazide (HYDRODIURIL) 12.5 MG tablet  Daily     04/12/19 1437    azithromycin (ZITHROMAX) 250 MG tablet  Daily     04/12/19 1437           Tilden Fossaees, Kuper Rennels, MD 04/12/19 1440

## 2019-04-12 NOTE — ED Notes (Signed)
Pt endorses no relief with x3 nitro

## 2019-04-12 NOTE — ED Notes (Signed)
Patient verbalizes understanding of discharge instructions. Opportunity for questioning and answers were provided. Armband removed by staff, pt discharged from ED.  

## 2019-04-12 NOTE — ED Notes (Signed)
Got patient undress on the monitor did ekg shown to er doctor patient is resting with call bell in reach 

## 2019-04-12 NOTE — ED Triage Notes (Signed)
Pt presents for left sided CP that radiates down his left arm. Pt denies SOB, NVD. Skin warm and dry, pt denies cardiac history. States pain is constant.

## 2019-04-12 NOTE — ED Notes (Signed)
Patient transported to CT 

## 2019-10-18 ENCOUNTER — Encounter (HOSPITAL_COMMUNITY): Payer: Self-pay

## 2019-10-18 ENCOUNTER — Emergency Department (HOSPITAL_COMMUNITY): Payer: Self-pay

## 2019-10-18 ENCOUNTER — Emergency Department (HOSPITAL_COMMUNITY)
Admission: EM | Admit: 2019-10-18 | Discharge: 2019-10-18 | Disposition: A | Discharge status: Home / Self Care | Payer: Self-pay | Attending: Emergency Medicine | Admitting: Emergency Medicine

## 2019-10-18 ENCOUNTER — Other Ambulatory Visit: Payer: Self-pay

## 2019-10-18 DIAGNOSIS — N451 Epididymitis: Secondary | ICD-10-CM | POA: Insufficient documentation

## 2019-10-18 DIAGNOSIS — Z79899 Other long term (current) drug therapy: Secondary | ICD-10-CM | POA: Insufficient documentation

## 2019-10-18 DIAGNOSIS — F17210 Nicotine dependence, cigarettes, uncomplicated: Secondary | ICD-10-CM | POA: Insufficient documentation

## 2019-10-18 DIAGNOSIS — I10 Essential (primary) hypertension: Secondary | ICD-10-CM | POA: Insufficient documentation

## 2019-10-18 LAB — URINALYSIS, ROUTINE W REFLEX MICROSCOPIC
Bilirubin Urine: NEGATIVE
Glucose, UA: NEGATIVE mg/dL
Ketones, ur: NEGATIVE mg/dL
Nitrite: NEGATIVE
Protein, ur: NEGATIVE mg/dL
Specific Gravity, Urine: 1.004 — ABNORMAL LOW (ref 1.005–1.030)
pH: 7 (ref 5.0–8.0)

## 2019-10-18 LAB — CBC WITH DIFFERENTIAL/PLATELET
Abs Immature Granulocytes: 0.07 10*3/uL (ref 0.00–0.07)
Basophils Absolute: 0.1 10*3/uL (ref 0.0–0.1)
Basophils Relative: 0 %
Eosinophils Absolute: 0.1 10*3/uL (ref 0.0–0.5)
Eosinophils Relative: 1 %
HCT: 46.2 % (ref 39.0–52.0)
Hemoglobin: 14.5 g/dL (ref 13.0–17.0)
Immature Granulocytes: 1 %
Lymphocytes Relative: 17 %
Lymphs Abs: 2.4 10*3/uL (ref 0.7–4.0)
MCH: 27.5 pg (ref 26.0–34.0)
MCHC: 31.4 g/dL (ref 30.0–36.0)
MCV: 87.5 fL (ref 80.0–100.0)
Monocytes Absolute: 1.1 10*3/uL — ABNORMAL HIGH (ref 0.1–1.0)
Monocytes Relative: 8 %
Neutro Abs: 10.5 10*3/uL — ABNORMAL HIGH (ref 1.7–7.7)
Neutrophils Relative %: 73 %
Platelets: 220 10*3/uL (ref 150–400)
RBC: 5.28 MIL/uL (ref 4.22–5.81)
RDW: 13.6 % (ref 11.5–15.5)
WBC: 14.3 10*3/uL — ABNORMAL HIGH (ref 4.0–10.5)
nRBC: 0 % (ref 0.0–0.2)

## 2019-10-18 LAB — BASIC METABOLIC PANEL
Anion gap: 8 (ref 5–15)
BUN: 7 mg/dL (ref 6–20)
CO2: 27 mmol/L (ref 22–32)
Calcium: 8.9 mg/dL (ref 8.9–10.3)
Chloride: 102 mmol/L (ref 98–111)
Creatinine, Ser: 0.85 mg/dL (ref 0.61–1.24)
GFR calc Af Amer: >60 mL/min (ref >60)
GFR calc non Af Amer: >60 mL/min (ref >60)
Glucose, Bld: 106 mg/dL — ABNORMAL HIGH (ref 70–99)
Potassium: 3.5 mmol/L (ref 3.5–5.1)
Sodium: 137 mmol/L (ref 135–145)

## 2019-10-18 MED ORDER — SODIUM CHLORIDE 0.9 % IV SOLN
1.0000 g | Freq: Once | INTRAVENOUS | Status: AC
  Administered 2019-10-18: 1 g via INTRAVENOUS
  Filled 2019-10-18: qty 10

## 2019-10-18 MED ORDER — DOXYCYCLINE HYCLATE 100 MG PO CAPS: 100.0000 mg | ORAL_CAPSULE | Freq: Two times a day (BID) | ORAL | 0 refills | Status: DC

## 2019-10-18 MED ORDER — MORPHINE SULFATE (PF) 4 MG/ML IV SOLN
4.0000 mg | Freq: Once | INTRAVENOUS | Status: AC
  Administered 2019-10-18: 4 mg via INTRAVENOUS
  Filled 2019-10-18: qty 1

## 2019-10-18 MED ORDER — OXYCODONE-ACETAMINOPHEN 5-325 MG PO TABS
2.0000 | ORAL_TABLET | Freq: Once | ORAL | Status: AC
  Administered 2019-10-18: 2 via ORAL
  Filled 2019-10-18: qty 2

## 2019-10-18 MED ORDER — OXYCODONE-ACETAMINOPHEN 5-325 MG PO TABS: 1.0000 | ORAL_TABLET | ORAL | 0 refills | Status: DC | PRN

## 2019-10-18 NOTE — ED Provider Notes (Signed)
[facility]-EMERGENCY DEPT Provider Note   CSN: [id number] Arrival date & time: 10/18/19  1253     History Chief Complaint  Patient presents with  . Testicle Pain  . Groin Pain    [name] is a 44 y.o. male.  He started with left-sided testicular pain that began 2 nights ago.  Is been getting progressively worse.  He has noticed some hematuria.  Sometimes the pain will radiate into the groin.  He rates it as 10 out of 10 intensity worse with any kind of movement.  No trauma.  Denies any STD exposure.  Never had this before.  The history is provided by the patient.  Testicle Pain This is a new problem. The current episode started 2 days ago. The problem occurs constantly. The problem has been gradually worsening. Pertinent negatives include no chest pain, no abdominal pain, no headaches and no shortness of breath. The symptoms are aggravated by twisting and bending. Nothing relieves the symptoms. He has tried nothing for the symptoms. The treatment provided no relief.       Past Medical History:  Diagnosis Date  . Hypertension     There are no problems to display for this patient.   Past Surgical History:  Procedure Laterality Date  . TONSILLECTOMY         No family history on file.  Social History   Tobacco Use  . Smoking status: Current Every Day Smoker    Packs/day: 1.00    Types: Cigarettes  . Smokeless tobacco: Never Used  Substance Use Topics  . Alcohol use: Yes    Comment: occ  . Drug use: No    Home Medications Prior to Admission medications   Medication Sig Start Date End Date Taking? Authorizing Provider  Aspirin-Salicylamide-Caffeine (BC HEADACHE POWDER PO) Take 1 packet by mouth every 6 (six) hours as needed (for pain).    [provider]  azithromycin (ZITHROMAX) 250 MG tablet Take 1 tablet (250 mg total) by mouth daily. Take first 2 tablets together, then 1 every day until finished. 04/12/19   Tilden Fossa, MD   hydrochlorothiazide (HYDRODIURIL) 12.5 MG tablet Take 1 tablet (12.5 mg total) by mouth daily. 04/12/19   Tilden Fossa, MD  HYDROcodone-acetaminophen (NORCO/VICODIN) 5-325 MG per tablet Take 1-2 tablets by mouth every 4 (four) hours as needed for moderate pain. Patient not taking: Reported on 04/12/2019 04/16/14   Ivonne Andrew, PA-C  lisinopril (ZESTRIL) 10 MG tablet Take 1 tablet (10 mg total) by mouth daily. 04/12/19   Tilden Fossa, MD  naproxen (NAPROSYN) 500 MG tablet Take 1 tablet (500 mg total) by mouth 2 (two) times daily. Patient not taking: Reported on 04/12/2019 04/16/14   Ivonne Andrew, PA-C  oxyCODONE-acetaminophen (PERCOCET/ROXICET) 5-325 MG per tablet Take 1 tablet by mouth every 6 (six) hours as needed for severe pain. Patient not taking: Reported on 04/12/2019 10/18/14   Catha Gosselin, PA-C    Allergies    Codeine and Penicillins  Review of Systems   Review of Systems  Constitutional: Negative for fever.  HENT: Negative for sore throat.   Eyes: Negative for visual disturbance.  Respiratory: Negative for shortness of breath.   Cardiovascular: Negative for chest pain.  Gastrointestinal: Negative for abdominal pain.  Genitourinary: Positive for hematuria and testicular pain. Negative for dysuria.  Musculoskeletal: Negative for back pain.  Skin: Negative for rash.  Neurological: Negative for headaches.    Physical Exam Updated Vital Signs BP 120/70 (BP Location: Left Arm)  Pulse 79   Temp 98.3 F (36.8 C) (Oral)   Resp 16   Ht 5\' 10"  (1.778 m)   Wt 83.9 kg   SpO2 99%   BMI 26.54 kg/m   Physical Exam Vitals and nursing note reviewed.  Constitutional:      Appearance: He is well-developed.  HENT:     Head: Normocephalic and atraumatic.  Eyes:     Conjunctiva/sclera: Conjunctivae normal.  Cardiovascular:     Rate and Rhythm: Normal rate and regular rhythm.     Heart sounds: No murmur.  Pulmonary:     Effort: Pulmonary effort is normal. No respiratory  distress.     Breath sounds: Normal breath sounds.  Abdominal:     Palpations: Abdomen is soft.     Tenderness: There is no abdominal tenderness.  Genitourinary:    Penis: Normal and circumcised.      Testes:        Right: Tenderness not present.        Left: Tenderness and swelling present.     Epididymis:     Left: Tenderness present.  Musculoskeletal:        General: No deformity or signs of injury.     Cervical back: Neck supple.  Skin:    General: Skin is warm and dry.     Capillary Refill: Capillary refill takes less than 2 seconds.  Neurological:     General: No focal deficit present.     Mental Status: He is alert.     ED Results / Procedures / Treatments   Labs (all labs ordered are listed, but only abnormal results are displayed) Labs Reviewed  URINALYSIS, ROUTINE W REFLEX MICROSCOPIC - Abnormal; Notable for the following components:      Result Value   Specific Gravity, Urine 1.004 (*)    Hgb urine dipstick MODERATE (*)    Leukocytes,Ua MODERATE (*)    Bacteria, UA MANY (*)    All other components within normal limits  BASIC METABOLIC PANEL - Abnormal; Notable for the following components:   Glucose, Bld 106 (*)    All other components within normal limits  CBC WITH DIFFERENTIAL/PLATELET - Abnormal; Notable for the following components:   WBC 14.3 (*)    Neutro Abs 10.5 (*)    Monocytes Absolute 1.1 (*)    All other components within normal limits  GC/CHLAMYDIA PROBE AMP ([place]) NOT AT [facility]    EKG None  Radiology [facility] SCROTUM W/DOPPLER  Result Date: 10/18/2019 CLINICAL DATA:  Left testicular pain EXAM: SCROTAL ULTRASOUND DOPPLER ULTRASOUND OF THE TESTICLES TECHNIQUE: Complete ultrasound examination of the testicles, epididymis, and other scrotal structures was performed. Color and spectral Doppler ultrasound were also utilized to evaluate blood flow to the testicles. COMPARISON:  Scrotal ultrasound 06/11/2011 FINDINGS: Right testicle Measurements: 4.4  x 2.3 x 3.1 cm. No mass or microlithiasis visualized. Left testicle Measurements: 3.3 x 2.4 x 2.8 cm. No mass or microlithiasis visualized. Right epididymis: Heterogeneous, hypoechoic and thickened appearance of the epididymis with increased color Doppler flow. Left epididymis: Heterogeneous, hypoechoic and thickened appearance of the epididymis with increased color Doppler flow. Hydrocele: Small bilateral hydroceles without septations or other complexity. Varicocele:  Mild bilateral varicoceles are present. Pulsed Doppler interrogation of both testes demonstrates normal low resistance arterial and venous waveforms bilaterally. IMPRESSION: Bilateral epididymal heterogeneity with increased color Doppler flow, findings are compatible with a bilateral epididymitis without convincing features of orchitis. Bilateral hydroceles without complexity, likely reactive. Mild bilateral varicocele. Electronically Signed  By: Lovena Le M.D.   On: 10/18/2019 15:27    Procedures Procedures (including critical care time)  Medications Ordered in ED Medications  oxyCODONE-acetaminophen (PERCOCET/ROXICET) 5-325 MG per tablet 2 tablet (2 tablets Oral Given 10/18/19 1406)  morphine 4 MG/ML injection 4 mg (4 mg Intravenous Given 10/18/19 1517)  cefTRIAXone (ROCEPHIN) 1 g in sodium chloride 0.9 % 100 mL IVPB (0 g Intravenous Stopped 10/18/19 1553)    ED Course  I have reviewed the triage vital signs and the nursing notes.  Pertinent labs & imaging results that were available during my care of the patient were reviewed by me and considered in my medical decision making (see chart for details).  Clinical Course as of Oct 17 1813  Mon Oct 18, 2019  1355 Differential includes torsion, epididymitis, orchitis, hydrocele, varicocele   [MB]  1721 Normal  Basic metabolic panel(!) [EW]  1761 Normal except presence of blood, leukocytes, white cells and bacteria  Urinalysis, Routine w reflex microscopic(!) [EW]  1721 Normal  except white count high  CBC with Differential(!) [EW]  1721 Consistent with epididymitis.  Interpreted by radiology.  US SCROTUM W/DOPPLER [EW]    Clinical Course User Index [EW] Daleen Bo, MD [MB] Hayden Rasmussen, MD   MDM Rules/Calculators/A&P                     Patient signed out to Dr Eulis Foster with plan to followup on [place] and labs. Anticipate discharge with pain meds and antibiotics.   Final Clinical Impression(s) / ED Diagnoses Final diagnoses:  Epididymitis    Rx / DC Orders ED Discharge Orders         Ordered    oxyCODONE-acetaminophen (PERCOCET/ROXICET) 5-325 MG tablet  Every 4 hours PRN     10/18/19 1738    doxycycline (VIBRAMYCIN) 100 MG capsule  2 times daily     10/18/19 1738           Hayden Rasmussen, MD 10/18/19 [phone]

## 2019-10-18 NOTE — ED Notes (Signed)
Assisted patient to the restroom and back to room.

## 2019-10-18 NOTE — ED Triage Notes (Signed)
Patient arrived POV.   Patient was at work 2 nights ago and felt slight pain in left testicle per patient.   C/o left groin and pulling pain in left testicle and c/o swelling and hardness in left testicle  10/10 pain   Denies injury at work

## 2019-10-18 NOTE — Discharge Instructions (Signed)
Rest until improved.

## 2019-10-18 NOTE — ED Provider Notes (Signed)
3:40 PM-checkout from Dr. Charm Barges to evaluate after completion of testing.  Patient presented for evaluation of pain in the left testicle.  So far, he has been treated with Percocet and morphine, and Rocephin.  GC and Chlamydia probe has been done and is pending.  Patient is sexually active.  5:25 PM-he is comfortable enough to go home.  I discussed the findings with him, we will send prescriptions to his pharmacy of choice.  He will be referred to urology for management of ongoing pain.  MDM-evaluation consistent with epididymitis, infectious, likely secondary to an STD.  Rocephin started in ED, will prescribe doxycycline and pain relief.  He will be referred to urology for management.   Mancel Bale, MD 10/18/19 [phone]

## 2019-10-19 ENCOUNTER — Telehealth (HOSPITAL_COMMUNITY): Payer: Self-pay | Admitting: Emergency Medicine

## 2019-10-19 MED ORDER — OXYCODONE-ACETAMINOPHEN 5-325 MG PO TABS: 1.0000 | ORAL_TABLET | ORAL | 0 refills | Status: DC | PRN

## 2019-10-19 NOTE — Telephone Encounter (Signed)
The E scribe feature was down and prescription was not sent to the pharmacy.  Patient requesting prescription for pain

## 2019-12-01 ENCOUNTER — Ambulatory Visit: Payer: Self-pay | Attending: Internal Medicine

## 2019-12-01 DIAGNOSIS — Z20822 Contact with and (suspected) exposure to covid-19: Secondary | ICD-10-CM

## 2019-12-02 LAB — SARS-COV-2, NAA 2 DAY TAT

## 2019-12-02 LAB — NOVEL CORONAVIRUS, NAA: SARS-CoV-2, NAA: NOT DETECTED

## 2019-12-27 IMAGING — CR PORTABLE CHEST - 1 VIEW
1 series · 1 of 1 positions shown · non-contrast
Comparison: Chest radiograph dated 02/10/2013

CLINICAL DATA: Pt presents for left sided CP that radiates down his
left arm. Pt denies SOB, NVD. Skin warm and dry, pt denies cardiac
history. States pain is constant. Hx HTN

EXAM:
PORTABLE CHEST 1 VIEW

[AP]
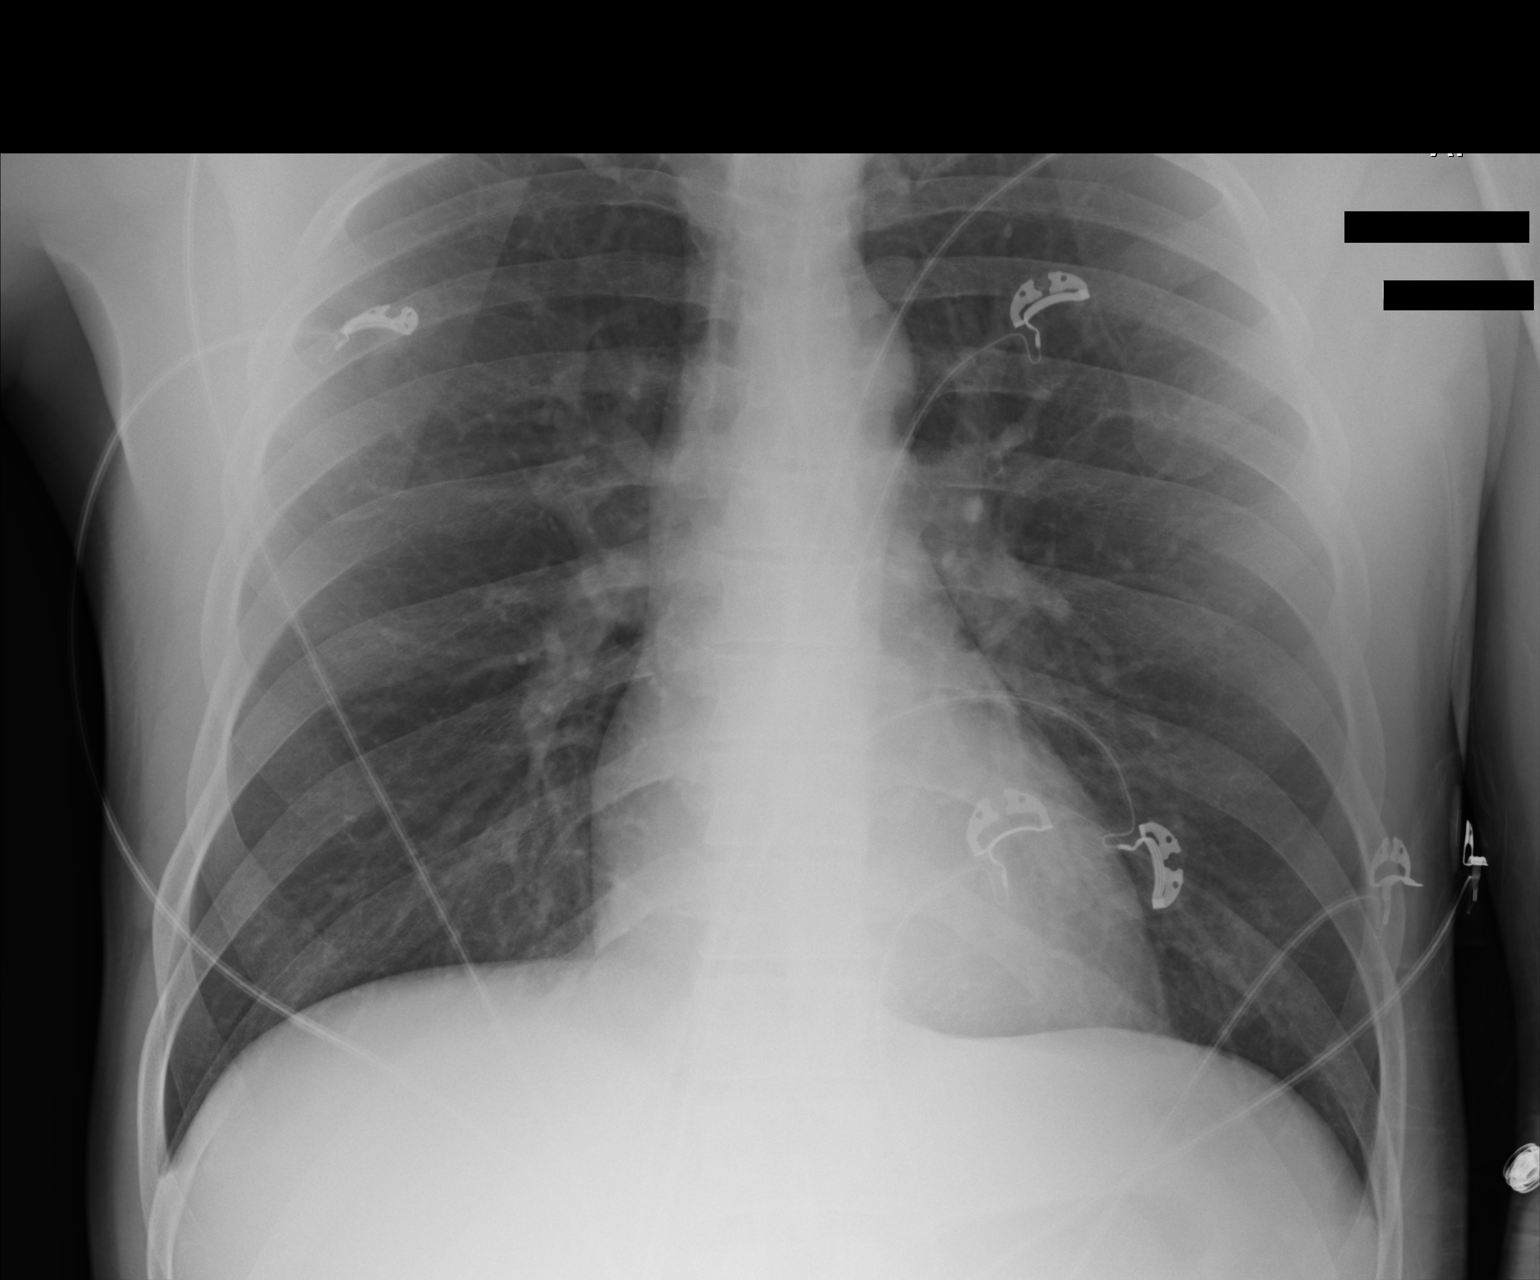

[1 of 1 positions shown; findings below may reference images not displayed]

FINDINGS: The heart size and mediastinal contours are within normal limits.
The lungs are clear. No pneumothorax or pleural effusion. The
visualized skeletal structures are unremarkable.
IMPRESSION: No acute cardiopulmonary process.

## 2020-07-03 IMAGING — US US SCROTUM W/ DOPPLER COMPLETE
1 series · 13 of 25 positions shown · non-contrast
Comparison: Scrotal ultrasound 06/11/2011

CLINICAL DATA: Left testicular pain

EXAM:
SCROTAL ULTRASOUND
DOPPLER ULTRASOUND OF THE TESTICLES
TECHNIQUE: Complete ultrasound examination of the testicles, epididymis, and
other scrotal structures was performed. Color and spectral Doppler
ultrasound were also utilized to evaluate blood flow to the
testicles.

[Series 1: us scrotum w/ doppler complete · 55 acquisitions, 13 frames shown]
[im 1/55]
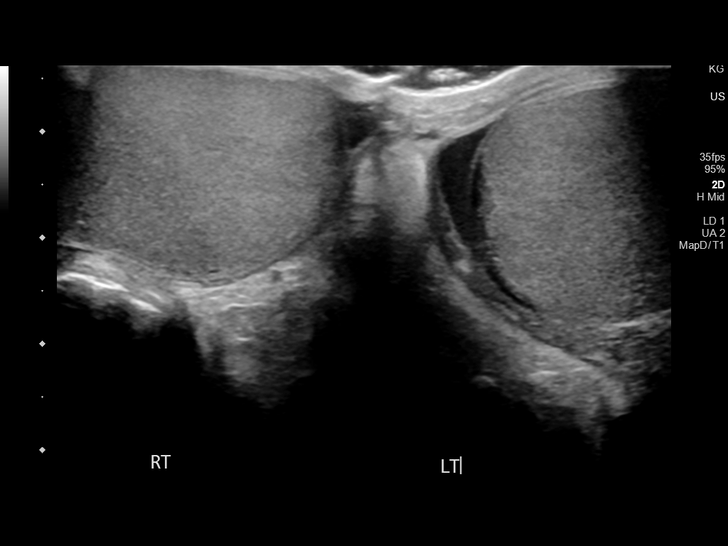
[im 5/55]
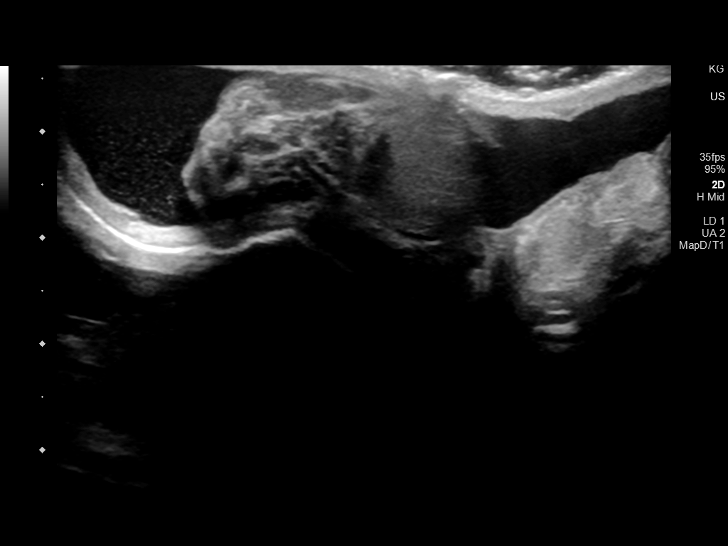
[im 10/55]
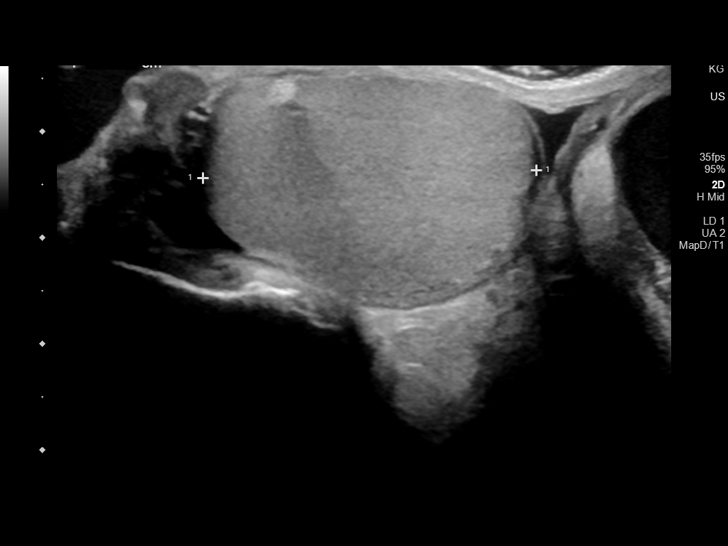
[im 14/55]
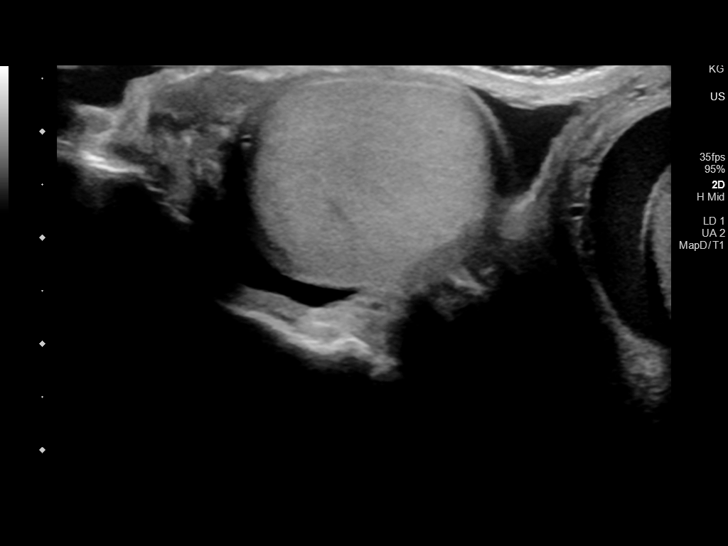
[im 19/55]
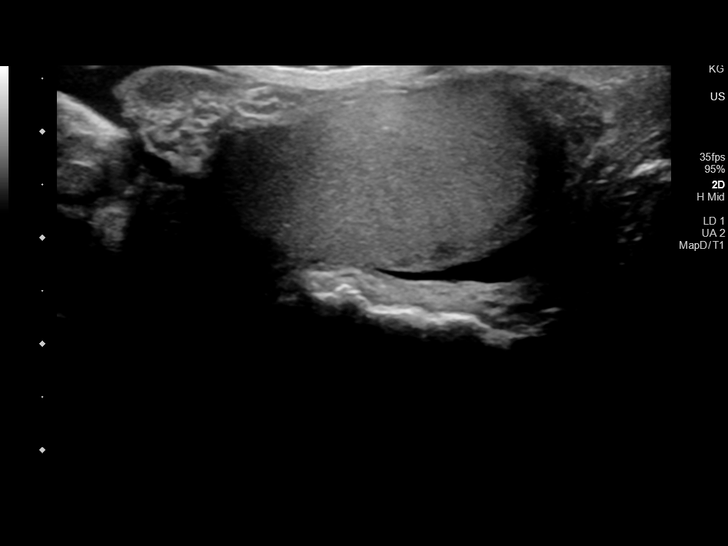
[im 23/55]
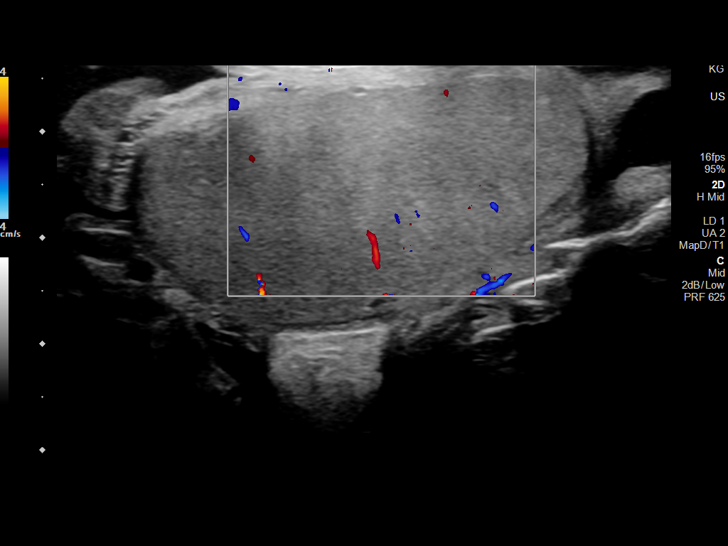
[im 28/55]
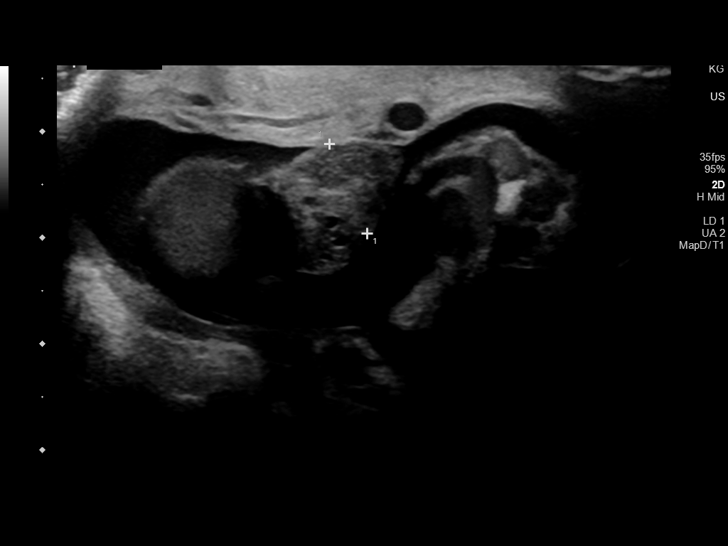
[im 32/55]
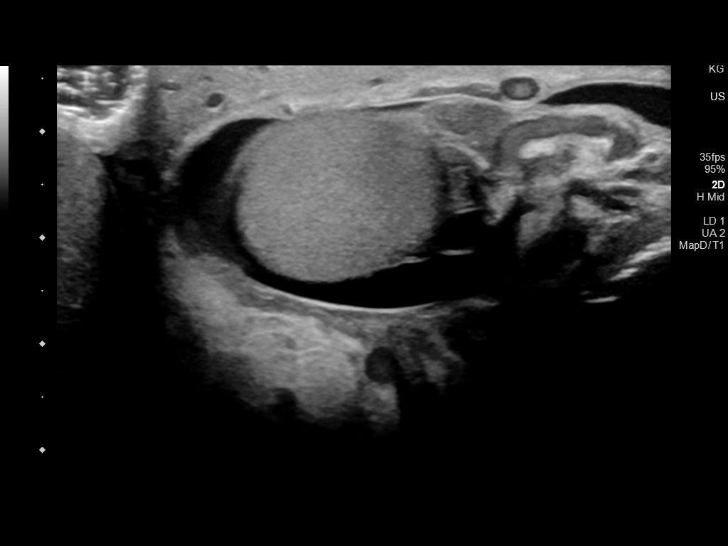
[im 37/55]
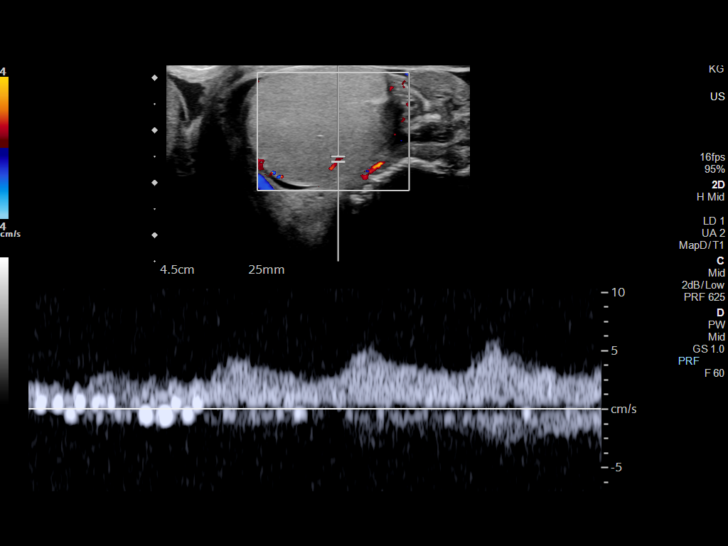
[im 41/55]
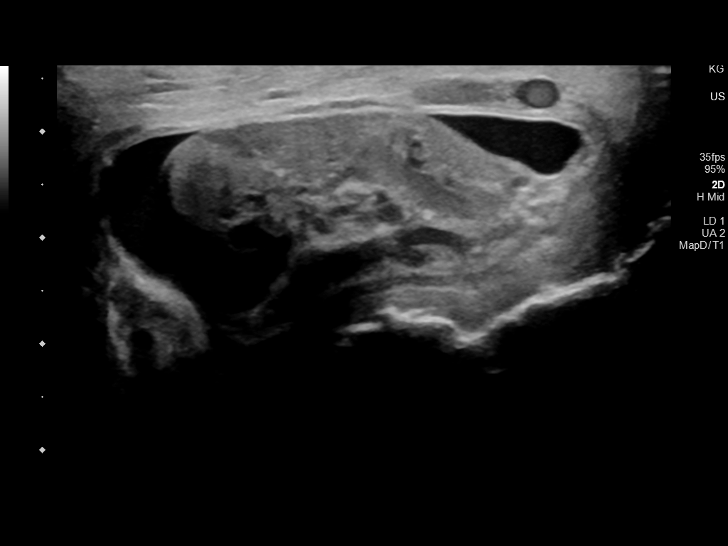
[im 46/55]
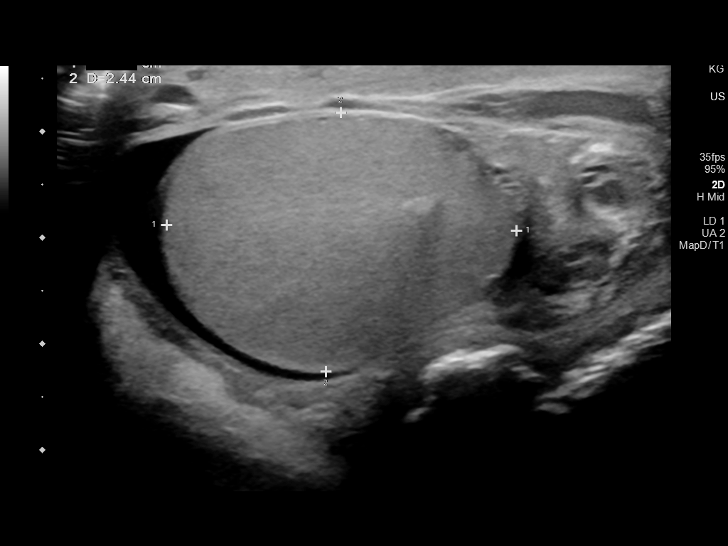
[im 50/55]
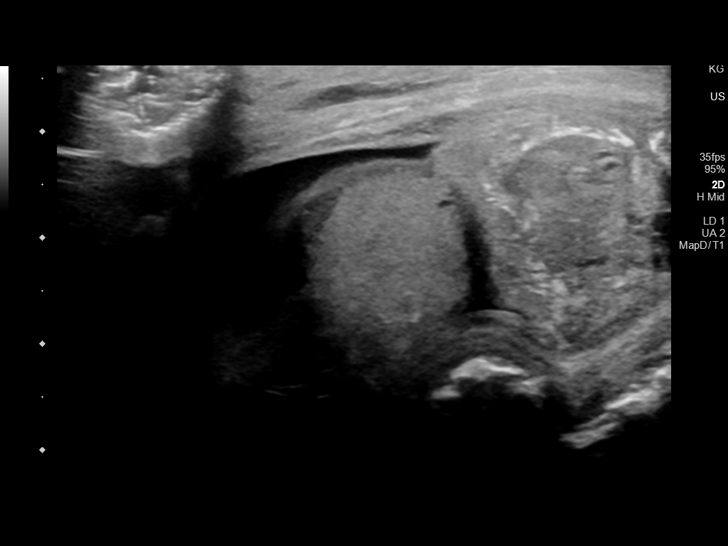
[im 55/55]
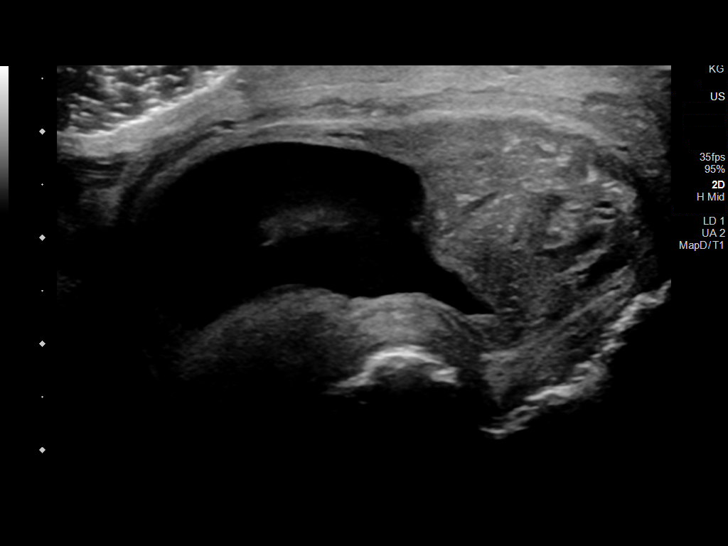

[13 of 25 positions shown; findings below may reference images not displayed]

FINDINGS: Right testicle

Measurements: 4.4 x 2.3 x 3.1 cm. No mass or microlithiasis
visualized.

Left testicle

Measurements: 3.3 x 2.4 x 2.8 cm. No mass or microlithiasis
visualized.

Right epididymis: Heterogeneous, hypoechoic and thickened appearance
of the epididymis with increased color Doppler flow.

Left epididymis: Heterogeneous, hypoechoic and thickened appearance
of the epididymis with increased color Doppler flow.

Hydrocele: Small bilateral hydroceles without septations or other
complexity.

Varicocele:  Mild bilateral varicoceles are present.

Pulsed Doppler interrogation of both testes demonstrates normal low
resistance arterial and venous waveforms bilaterally.
IMPRESSION: Bilateral epididymal heterogeneity with increased color Doppler
flow, findings are compatible with a bilateral epididymitis without
convincing features of orchitis.

Bilateral hydroceles without complexity, likely reactive.

Mild bilateral varicocele.

## 2021-09-07 ENCOUNTER — Emergency Department: Admission: EM | Admit: 2021-09-07 | Discharge: 2021-09-07 | Discharge status: Against Medical Advice | Payer: Self-pay

## 2021-09-07 NOTE — ED Notes (Signed)
No answer when called for triage 

## 2021-11-02 DIAGNOSIS — Z03818 Encounter for observation for suspected exposure to other biological agents ruled out: Secondary | ICD-10-CM | POA: Diagnosis not present

## 2021-11-02 DIAGNOSIS — Z20822 Contact with and (suspected) exposure to covid-19: Secondary | ICD-10-CM | POA: Diagnosis not present

## 2021-12-18 DIAGNOSIS — R519 Headache, unspecified: Secondary | ICD-10-CM | POA: Diagnosis not present

## 2021-12-18 DIAGNOSIS — M62830 Muscle spasm of back: Secondary | ICD-10-CM | POA: Diagnosis not present

## 2021-12-18 DIAGNOSIS — F902 Attention-deficit hyperactivity disorder, combined type: Secondary | ICD-10-CM | POA: Diagnosis not present

## 2021-12-18 DIAGNOSIS — K625 Hemorrhage of anus and rectum: Secondary | ICD-10-CM | POA: Diagnosis not present

## 2021-12-18 DIAGNOSIS — I10 Essential (primary) hypertension: Secondary | ICD-10-CM | POA: Diagnosis not present

## 2021-12-18 DIAGNOSIS — Z125 Encounter for screening for malignant neoplasm of prostate: Secondary | ICD-10-CM | POA: Diagnosis not present

## 2021-12-18 DIAGNOSIS — F17210 Nicotine dependence, cigarettes, uncomplicated: Secondary | ICD-10-CM | POA: Diagnosis not present

## 2021-12-18 DIAGNOSIS — Z13220 Encounter for screening for lipoid disorders: Secondary | ICD-10-CM | POA: Diagnosis not present

## 2021-12-18 DIAGNOSIS — Z1321 Encounter for screening for nutritional disorder: Secondary | ICD-10-CM | POA: Diagnosis not present

## 2021-12-18 DIAGNOSIS — G4700 Insomnia, unspecified: Secondary | ICD-10-CM | POA: Diagnosis not present

## 2021-12-18 DIAGNOSIS — F419 Anxiety disorder, unspecified: Secondary | ICD-10-CM | POA: Diagnosis not present

## 2021-12-19 DIAGNOSIS — G4700 Insomnia, unspecified: Secondary | ICD-10-CM | POA: Insufficient documentation

## 2021-12-19 DIAGNOSIS — Z7689 Persons encountering health services in other specified circumstances: Secondary | ICD-10-CM | POA: Insufficient documentation

## 2021-12-19 DIAGNOSIS — K625 Hemorrhage of anus and rectum: Secondary | ICD-10-CM | POA: Insufficient documentation

## 2021-12-19 DIAGNOSIS — I10 Essential (primary) hypertension: Secondary | ICD-10-CM | POA: Insufficient documentation

## 2021-12-19 DIAGNOSIS — M62830 Muscle spasm of back: Secondary | ICD-10-CM | POA: Insufficient documentation

## 2021-12-19 DIAGNOSIS — R519 Headache, unspecified: Secondary | ICD-10-CM | POA: Insufficient documentation

## 2021-12-19 DIAGNOSIS — F902 Attention-deficit hyperactivity disorder, combined type: Secondary | ICD-10-CM | POA: Insufficient documentation

## 2021-12-19 DIAGNOSIS — F419 Anxiety disorder, unspecified: Secondary | ICD-10-CM | POA: Insufficient documentation

## 2021-12-27 DIAGNOSIS — K625 Hemorrhage of anus and rectum: Secondary | ICD-10-CM | POA: Diagnosis not present

## 2022-01-01 DIAGNOSIS — F902 Attention-deficit hyperactivity disorder, combined type: Secondary | ICD-10-CM | POA: Diagnosis not present

## 2022-01-01 DIAGNOSIS — G8929 Other chronic pain: Secondary | ICD-10-CM | POA: Diagnosis not present

## 2022-01-01 DIAGNOSIS — G4700 Insomnia, unspecified: Secondary | ICD-10-CM | POA: Diagnosis not present

## 2022-01-01 DIAGNOSIS — R519 Headache, unspecified: Secondary | ICD-10-CM | POA: Diagnosis not present

## 2022-02-27 DIAGNOSIS — K640 First degree hemorrhoids: Secondary | ICD-10-CM | POA: Diagnosis not present

## 2022-02-27 DIAGNOSIS — K625 Hemorrhage of anus and rectum: Secondary | ICD-10-CM | POA: Diagnosis not present

## 2022-02-27 DIAGNOSIS — K644 Residual hemorrhoidal skin tags: Secondary | ICD-10-CM | POA: Diagnosis not present

## 2022-04-03 DIAGNOSIS — G4700 Insomnia, unspecified: Secondary | ICD-10-CM | POA: Diagnosis not present

## 2022-04-03 DIAGNOSIS — R519 Headache, unspecified: Secondary | ICD-10-CM | POA: Diagnosis not present

## 2022-04-03 DIAGNOSIS — I10 Essential (primary) hypertension: Secondary | ICD-10-CM | POA: Insufficient documentation

## 2022-04-03 DIAGNOSIS — G8929 Other chronic pain: Secondary | ICD-10-CM | POA: Insufficient documentation

## 2022-04-03 DIAGNOSIS — F902 Attention-deficit hyperactivity disorder, combined type: Secondary | ICD-10-CM | POA: Diagnosis not present

## 2022-07-04 DIAGNOSIS — E782 Mixed hyperlipidemia: Secondary | ICD-10-CM | POA: Diagnosis not present

## 2022-07-04 DIAGNOSIS — E559 Vitamin D deficiency, unspecified: Secondary | ICD-10-CM | POA: Diagnosis not present

## 2022-07-04 DIAGNOSIS — I10 Essential (primary) hypertension: Secondary | ICD-10-CM | POA: Diagnosis not present

## 2022-07-04 DIAGNOSIS — R7303 Prediabetes: Secondary | ICD-10-CM | POA: Diagnosis not present

## 2022-08-06 DIAGNOSIS — U071 COVID-19: Secondary | ICD-10-CM | POA: Diagnosis not present

## 2022-08-21 DIAGNOSIS — M62830 Muscle spasm of back: Secondary | ICD-10-CM | POA: Diagnosis not present

## 2022-08-21 DIAGNOSIS — I10 Essential (primary) hypertension: Secondary | ICD-10-CM | POA: Diagnosis not present

## 2022-08-21 DIAGNOSIS — E782 Mixed hyperlipidemia: Secondary | ICD-10-CM | POA: Diagnosis not present

## 2022-08-21 DIAGNOSIS — R7303 Prediabetes: Secondary | ICD-10-CM | POA: Diagnosis not present

## 2022-08-25 DIAGNOSIS — E782 Mixed hyperlipidemia: Secondary | ICD-10-CM | POA: Insufficient documentation

## 2022-08-25 DIAGNOSIS — R7303 Prediabetes: Secondary | ICD-10-CM | POA: Insufficient documentation

## 2022-08-25 DIAGNOSIS — M79671 Pain in right foot: Secondary | ICD-10-CM | POA: Insufficient documentation

## 2022-08-25 DIAGNOSIS — S90811A Abrasion, right foot, initial encounter: Secondary | ICD-10-CM | POA: Insufficient documentation

## 2022-10-28 DIAGNOSIS — M542 Cervicalgia: Secondary | ICD-10-CM | POA: Diagnosis not present

## 2022-10-28 DIAGNOSIS — G8929 Other chronic pain: Secondary | ICD-10-CM | POA: Diagnosis not present

## 2022-11-05 DIAGNOSIS — M542 Cervicalgia: Secondary | ICD-10-CM | POA: Insufficient documentation

## 2022-11-05 DIAGNOSIS — M5412 Radiculopathy, cervical region: Secondary | ICD-10-CM | POA: Insufficient documentation

## 2022-11-11 ENCOUNTER — Emergency Department: Payer: Self-pay

## 2022-11-11 ENCOUNTER — Emergency Department
Admission: EM | Admit: 2022-11-11 | Discharge: 2022-11-11 | Disposition: A | Discharge status: Home / Self Care | Payer: Self-pay | Attending: Emergency Medicine | Admitting: Emergency Medicine

## 2022-11-11 ENCOUNTER — Encounter: Payer: Self-pay | Admitting: Emergency Medicine

## 2022-11-11 DIAGNOSIS — M5412 Radiculopathy, cervical region: Secondary | ICD-10-CM | POA: Diagnosis not present

## 2022-11-11 MED ORDER — METHOCARBAMOL 500 MG PO TABS
1000.0000 mg | ORAL_TABLET | Freq: Once | ORAL | Status: AC
  Administered 2022-11-11: 1000 mg via ORAL
  Filled 2022-11-11: qty 2

## 2022-11-11 MED ORDER — OXYCODONE-ACETAMINOPHEN 5-325 MG PO TABS
1.0000 | ORAL_TABLET | Freq: Once | ORAL | Status: AC
  Administered 2022-11-11: 1 via ORAL
  Filled 2022-11-11: qty 1

## 2022-11-11 MED ORDER — METHOCARBAMOL 500 MG PO TABS: 500.0000 mg | ORAL_TABLET | Freq: Four times a day (QID) | ORAL | 0 refills | Status: DC

## 2022-11-11 MED ORDER — DEXAMETHASONE SODIUM PHOSPHATE 10 MG/ML IJ SOLN
10.0000 mg | Freq: Once | INTRAMUSCULAR | Status: AC
  Administered 2022-11-11: 10 mg via INTRAMUSCULAR
  Filled 2022-11-11: qty 1

## 2022-11-11 MED ORDER — MELOXICAM 15 MG PO TABS: 15.0000 mg | ORAL_TABLET | Freq: Every day | ORAL | 0 refills | Status: DC

## 2022-11-11 MED ORDER — KETOROLAC TROMETHAMINE 30 MG/ML IJ SOLN
30.0000 mg | Freq: Once | INTRAMUSCULAR | Status: AC
  Administered 2022-11-11: 30 mg via INTRAMUSCULAR
  Filled 2022-11-11: qty 1

## 2022-11-11 NOTE — ED Provider Notes (Signed)
[facility] Provider Note  Patient Contact: 11:26 PM (approximate)   History   Arm Pain   HPI  [name] is a 47 y.o. male who presents emergency department with pain radiating from his neck into his left arm.  Patient has a history of similar symptoms typically are intermittent in nature.  They have been more persistent over the last day.  No loss of range of motion, sensation.  Patient has full range of motion to his neck and his arm.  Denies any acute injury to his head or his neck region.  No other complaints at this time.     Physical Exam   Triage Vital Signs: ED Triage Vitals  Enc Vitals Group     BP 11/11/22 2006 (!) 138/100     Pulse Rate 11/11/22 2006 73     Resp 11/11/22 2006 18     Temp 11/11/22 2006 98.3 F (36.8 C)     Temp Source 11/11/22 2006 Oral     SpO2 11/11/22 2006 100 %     Weight 11/11/22 2007 186 lb 4.6 oz (84.5 kg)     Height 11/11/22 2007 5\' 10"  (1.778 m)     Head Circumference --      Peak Flow --      Pain Score 11/11/22 2007 10     Pain Loc --      Pain Edu? --      Excl. in [place]? --     Most recent vital signs: Vitals:   11/11/22 2006  BP: (!) 138/100  Pulse: 73  Resp: 18  Temp: 98.3 F (36.8 C)  SpO2: 100%     General: Alert and in no acute distress. Head: No acute traumatic findings  Neck: No stridor. No cervical spine tenderness to palpation  Cardiovascular:  Good peripheral perfusion Respiratory: Normal respiratory effort without tachypnea or retractions. Lungs CTAB. Good air entry to the bases with no decreased or absent breath sounds Musculoskeletal: Full range of motion to all extremities.  No visible signs of trauma to the neck.  Full range of motion to the neck, left arm.  Sensation and capillary refill intact to the left arm. Neurologic:  No gross focal neurologic deficits are appreciated.  Skin:   No rash noted Other:   ED Results / Procedures / Treatments   Labs (all labs ordered  are listed, but only abnormal results are displayed) Labs Reviewed - No data to display   EKG     RADIOLOGY  I personally viewed, evaluated, and interpreted these images as part of my medical decision making, as well as reviewing the written report by the radiologist.  ED Provider Interpretation: Mild degenerative changes without acute finding.  Straightening of the normal cervical lordosis.  DG Cervical Spine 2-3 Views  Result Date: 11/11/2022 CLINICAL DATA:  Cervical radiculopathy EXAM: CERVICAL SPINE - 2-3 VIEW COMPARISON:  11/08/2009 FINDINGS: Straightening of usual cervical lordosis is similar to prior study and likely positional although muscle spasm could also have this appearance. Mild degenerative changes with disc space narrowing and osteophyte formation at the endplates at 075-GRM and [id number] levels. This is progressing since the prior study. No vertebral compression deformities. No focal bone lesions. No prevertebral soft tissue swelling. C1-2 articulation appears intact. 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 Nonspecific straightening of usual cervical lordosis is unchanged. Electronically Signed   By: Lucienne Capers M.D.   On: 11/11/2022 22:55  PROCEDURES:  Critical Care performed: No  Procedures   MEDICATIONS ORDERED IN ED: Medications  dexamethasone (DECADRON) injection 10 mg (has no administration in time range)  ketorolac (TORADOL) 30 MG/ML injection 30 mg (has no administration in time range)  oxyCODONE-acetaminophen (PERCOCET/ROXICET) 5-325 MG per tablet 1 tablet (1 tablet Oral Given 11/11/22 2201)  methocarbamol (ROBAXIN) tablet 1,000 mg (1,000 mg Oral Given 11/11/22 2201)     IMPRESSION / MDM / ASSESSMENT AND PLAN / ED COURSE  I reviewed the triage vital signs and the nursing notes.                                 Differential diagnosis includes, but is not limited to, cervical radiculopathy,  herniated disc, bulging disc, cervical arthritis  Patient's presentation is most consistent with acute presentation with potential threat to life or bodily function.   Patient's diagnosis is consistent with cervical radiculopathy.  Patient presents emergency department with increased pain rating down his left arm.  History of same.  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  No acute injury reported.  No loss of motion or sensation.  X-ray reveals chronic degenerative changes slightly worse than previous imaging as well as straightening of the normal cervical lordosis consistent with muscle spasm.  Patient will be treated with anti-inflammatory, muscle relaxer.  Follow-up primary care as needed.   Patient is given ED precautions to return to the ED for any worsening or new symptoms.     FINAL CLINICAL IMPRESSION(S) / ED DIAGNOSES   Final diagnoses:  Cervical radiculopathy     Rx / DC Orders   ED Discharge Orders          Ordered    meloxicam (MOBIC) 15 MG tablet  Daily        11/11/22 2331    methocarbamol (ROBAXIN) 500 MG tablet  4 times daily        11/11/22 2331             Note:  This document was prepared using Dragon voice recognition software and may include unintentional dictation errors.   Brynda Peon 11/11/22 2331    Lavonia Drafts, MD 11/12/22 1530

## 2022-11-11 NOTE — ED Triage Notes (Signed)
Pt presents ambulatory to triage via POV with complaints of L bicep pain for the last several months. He notes it occurs intermittently due to bulging discs in his spine. Pt visibly uncomfortable in triage - challenging to get a full assessment due to his discomfort. Rates the pain 10/10. A&Ox4 at this time. Denies CP or SOB.

## 2022-11-11 NOTE — ED Notes (Signed)
Pt Dc to home. Dc instructions reviewed with all questions answered. Pt voices understanding. Pt ambulatory out of dept with steady gait with support person

## 2022-11-13 DIAGNOSIS — M542 Cervicalgia: Secondary | ICD-10-CM | POA: Diagnosis not present

## 2022-11-13 DIAGNOSIS — M792 Neuralgia and neuritis, unspecified: Secondary | ICD-10-CM | POA: Diagnosis not present

## 2022-11-16 DIAGNOSIS — M5412 Radiculopathy, cervical region: Secondary | ICD-10-CM | POA: Diagnosis not present

## 2022-11-18 ENCOUNTER — Other Ambulatory Visit: Payer: Self-pay

## 2022-11-18 ENCOUNTER — Emergency Department
Admission: EM | Admit: 2022-11-18 | Discharge: 2022-11-18 | Disposition: A | Discharge status: Home / Self Care | Payer: BC Managed Care – PPO | Attending: Emergency Medicine | Admitting: Emergency Medicine

## 2022-11-18 DIAGNOSIS — M5412 Radiculopathy, cervical region: Secondary | ICD-10-CM | POA: Insufficient documentation

## 2022-11-18 DIAGNOSIS — I10 Essential (primary) hypertension: Secondary | ICD-10-CM | POA: Diagnosis not present

## 2022-11-18 DIAGNOSIS — M542 Cervicalgia: Secondary | ICD-10-CM | POA: Diagnosis not present

## 2022-11-18 MED ORDER — OXYCODONE-ACETAMINOPHEN 5-325 MG PO TABS: 1.0000 | ORAL_TABLET | ORAL | 0 refills | Status: DC | PRN

## 2022-11-18 NOTE — ED Provider Notes (Signed)
[facility] Provider Note    Event Date/Time   First MD Initiated Contact with Patient 11/18/22 2100     (approximate)  History   Chief Complaint: Neck Pain and Arm Pain  HPI  [name] is a 47 y.o. male with a past medical history of hypertension who presents emergency department for continued neck pain with radiation down the left arm.  Patient states this has been ongoing for a month now.  He has seen his doctor they ordered an MRI which was completed several days ago waiting on results.  Has an appointment on the fifth with a surgeon to discuss next [place] treatment.  Patient states he has not been able to sleep due to pain, has been using Tylenol and ibuprofen without much relief.  States he was prescribed 5 tablets of pain medication a week or so ago at an urgent care but has no more pain medication.  Is requesting pain medicine until he can see his surgeon in 4 days.  Physical Exam   Triage Vital Signs: ED Triage Vitals  Enc Vitals Group     BP 11/18/22 2016 (!) 162/96     Pulse Rate 11/18/22 2016 83     Resp 11/18/22 2016 20     Temp 11/18/22 2016 98.1 F (36.7 C)     Temp Source 11/18/22 2016 Oral     SpO2 11/18/22 2016 100 %     Weight 11/18/22 2017 175 lb (79.4 kg)     Height 11/18/22 2017 5\' 10"  (1.778 m)     Head Circumference --      Peak Flow --      Pain Score 11/18/22 2017 10     Pain Loc --      Pain Edu? --      Excl. in [place]? --     Most recent vital signs: Vitals:   11/18/22 2016  BP: (!) 162/96  Pulse: 83  Resp: 20  Temp: 98.1 F (36.7 C)  SpO2: 100%    General: Awake, no distress.  CV:  Good peripheral perfusion.  Regular rate and rhythm  Resp:  Normal effort.  Equal breath sounds bilaterally.  Abd:  No distention.  Soft, nontender.  No rebound or guarding.  ED Results / Procedures / Treatments   MEDICATIONS ORDERED IN ED: Medications - No data to display   IMPRESSION / MDM / [place] / ED COURSE   I reviewed the triage vital signs and the nursing notes.  Patient's presentation is most consistent with acute illness / injury with system symptoms.  Patient presents emergency department for continued neck pain with left arm radiation states has been ongoing at least 1 month.  Patient had an MRI performed several days ago was awaiting on results has an appointment with a surgeon to discuss next steps treatments or operation on 11/22/2022.  I reviewed the patient's PDMP he did have pain medicine prescribed at the beginning of March and again 5 tablets recently.  Will prescribe a short course of pain medication for the patient till he can see his surgeon on the fifth.  Did discuss with the patient we would not be able to provide any further pain medications from the emergency department and that he would need to see his doctor for ongoing pain management.  Patient agreeable to plan of care.  No significant findings or red flags on my evaluation or history.  FINAL CLINICAL IMPRESSION(S) / ED DIAGNOSES  Cervical radiculopathy    Note:  This document was prepared using Dragon voice recognition software and may include unintentional dictation errors.   Harvest Dark, MD 11/18/22 2130

## 2022-11-18 NOTE — ED Triage Notes (Signed)
Patient ambulatory to triage reporting neck pain that radiates down left arm that has persisted for the past month. States he has had MRI Saturday and is awaiting results but states he needs something for the pain. Denies chest pain or shortness of breath. AOX4. Resp even, unlabored on RA.

## 2022-11-18 NOTE — ED Notes (Signed)
Patient declines EKG, states "I know it's not my heart. I know it's my neck."

## 2022-11-28 DIAGNOSIS — M5412 Radiculopathy, cervical region: Secondary | ICD-10-CM | POA: Diagnosis not present

## 2022-12-03 DIAGNOSIS — Z0000 Encounter for general adult medical examination without abnormal findings: Secondary | ICD-10-CM | POA: Diagnosis not present

## 2022-12-03 DIAGNOSIS — G8929 Other chronic pain: Secondary | ICD-10-CM | POA: Diagnosis not present

## 2022-12-03 DIAGNOSIS — B369 Superficial mycosis, unspecified: Secondary | ICD-10-CM | POA: Diagnosis not present

## 2022-12-10 DIAGNOSIS — M542 Cervicalgia: Secondary | ICD-10-CM | POA: Diagnosis not present

## 2022-12-19 DIAGNOSIS — M5412 Radiculopathy, cervical region: Secondary | ICD-10-CM | POA: Diagnosis not present

## 2023-01-08 DIAGNOSIS — M542 Cervicalgia: Secondary | ICD-10-CM | POA: Diagnosis not present

## 2023-01-08 DIAGNOSIS — M5412 Radiculopathy, cervical region: Secondary | ICD-10-CM | POA: Diagnosis not present

## 2023-01-09 DIAGNOSIS — M5412 Radiculopathy, cervical region: Secondary | ICD-10-CM | POA: Diagnosis not present

## 2023-01-24 DIAGNOSIS — M542 Cervicalgia: Secondary | ICD-10-CM | POA: Diagnosis not present

## 2023-04-30 ENCOUNTER — Other Ambulatory Visit: Payer: Self-pay | Admitting: Orthopedic Surgery

## 2023-04-30 DIAGNOSIS — M542 Cervicalgia: Secondary | ICD-10-CM

## 2023-05-07 ENCOUNTER — Other Ambulatory Visit: Payer: BC Managed Care – PPO

## 2023-05-20 DIAGNOSIS — M5412 Radiculopathy, cervical region: Secondary | ICD-10-CM | POA: Diagnosis not present

## 2023-06-13 DIAGNOSIS — M542 Cervicalgia: Secondary | ICD-10-CM | POA: Diagnosis not present

## 2023-06-30 ENCOUNTER — Emergency Department
Admission: EM | Admit: 2023-06-30 | Discharge: 2023-07-01 | Disposition: A | Discharge status: Home / Self Care | Payer: BC Managed Care – PPO | Attending: Emergency Medicine | Admitting: Emergency Medicine

## 2023-06-30 ENCOUNTER — Other Ambulatory Visit: Payer: Self-pay

## 2023-06-30 ENCOUNTER — Other Ambulatory Visit (HOSPITAL_COMMUNITY): Payer: Self-pay | Admitting: Orthopedic Surgery

## 2023-06-30 DIAGNOSIS — M79602 Pain in left arm: Secondary | ICD-10-CM | POA: Diagnosis not present

## 2023-06-30 DIAGNOSIS — G894 Chronic pain syndrome: Secondary | ICD-10-CM | POA: Insufficient documentation

## 2023-06-30 DIAGNOSIS — M542 Cervicalgia: Secondary | ICD-10-CM

## 2023-06-30 MED ORDER — KETOROLAC TROMETHAMINE 30 MG/ML IJ SOLN
30.0000 mg | Freq: Once | INTRAMUSCULAR | Status: DC
  Filled 2023-06-30: qty 1

## 2023-06-30 MED ORDER — OXYCODONE HCL 5 MG PO TABS
5.0000 mg | ORAL_TABLET | Freq: Once | ORAL | Status: AC
  Administered 2023-07-01: 5 mg via ORAL
  Filled 2023-06-30: qty 1

## 2023-06-30 MED ORDER — LIDOCAINE 5 % EX PTCH
1.0000 | MEDICATED_PATCH | CUTANEOUS | Status: DC
  Administered 2023-07-01: 1 via TRANSDERMAL
  Filled 2023-06-30: qty 1

## 2023-06-30 NOTE — ED Provider Notes (Signed)
[facility] Provider Note    Event Date/Time   First MD Initiated Contact with Patient 06/30/23 2348     (approximate)   History   Arm Injury   HPI  [name] is a 47 y.o. male who presents to the ED for evaluation of Arm Injury   I reviewed previous ED visits and [facility] clinic visits for cervical radiculopathy.  Previously receiving left C7 nerve root blocks.  Patient presents with chronic pain to his left shoulder , neck radiating down his left arm. No trauma. Months of pain. He is asking for "something to just help me sleep tonight." No acute events   Physical Exam   Triage Vital Signs: ED Triage Vitals  Encounter Vitals Group     BP 06/30/23 2116 (!) 168/92     Systolic BP Percentile --      Diastolic BP Percentile --      Pulse Rate 06/30/23 2116 70     Resp 06/30/23 2116 20     Temp 06/30/23 2116 (!) 97.5 F (36.4 C)     Temp Source 06/30/23 2116 Oral     SpO2 06/30/23 2116 96 %     Weight 06/30/23 2115 170 lb (77.1 kg)     Height 06/30/23 2115 5\' 10"  (1.778 m)     Head Circumference --      Peak Flow --      Pain Score 06/30/23 2115 10     Pain Loc --      Pain Education --      Exclude from Growth Chart --     Most recent vital signs: Vitals:   06/30/23 2116  BP: (!) 168/92  Pulse: 70  Resp: 20  Temp: (!) 97.5 F (36.4 C)  SpO2: 96%    General: Awake, no distress.  CV:  Good peripheral perfusion.  Resp:  Normal effort.  Abd:  No distention.  MSK:  No deformity noted.  Neuro:  No focal deficits appreciated. Other:     ED Results / Procedures / Treatments   Labs (all labs ordered are listed, but only abnormal results are displayed) Labs Reviewed - No data to display  EKG   RADIOLOGY   Official radiology report(s): No results found.  PROCEDURES and INTERVENTIONS:  Procedures  Medications  ketorolac (TORADOL) 30 MG/ML injection 30 mg (30 mg Intramuscular Patient Refused/Not Given 07/01/23  0005)  lidocaine (LIDODERM) 5 % 1 patch (1 patch Transdermal Patch Applied 07/01/23 0006)  oxyCODONE (Oxy IR/ROXICODONE) immediate release tablet 5 mg (5 mg Oral Given 07/01/23 0005)     IMPRESSION / MDM / ASSESSMENT AND PLAN / ED COURSE  I reviewed the triage vital signs and the nursing notes.  Differential diagnosis includes, but is not limited to, chronic pain, fracture, dislocation, cellulitis   Patient presents with chronic pain syndrome without acute features suitable for outpatient management.       FINAL CLINICAL IMPRESSION(S) / ED DIAGNOSES   Final diagnoses:  Chronic pain syndrome     Rx / DC Orders   ED Discharge Orders          Ordered    Ambulatory Referral to Primary Care (Establish Care)       Comments: Chronic pain   07/01/23 0012             Note:  This document was prepared using Dragon voice recognition software and may include unintentional dictation errors.   Delton [place], MD 07/01/23 [phone]

## 2023-06-30 NOTE — ED Triage Notes (Signed)
Pt to ED via POV c/o left arm pain. Pt states he has had this arm pain for a month. Pt had a neck injuryand was supposed to get an MRI on Saturday but was unable to go. Describes pain as constant throbbing pain in his bones.

## 2023-07-02 DIAGNOSIS — M542 Cervicalgia: Secondary | ICD-10-CM | POA: Diagnosis not present

## 2023-07-04 ENCOUNTER — Ambulatory Visit (HOSPITAL_COMMUNITY)
Admission: RE | Admit: 2023-07-04 | Discharge: 2023-07-04 | Disposition: A | Discharge status: Home / Self Care | Payer: BC Managed Care – PPO | Source: Ambulatory Visit | Attending: Orthopedic Surgery | Admitting: Orthopedic Surgery

## 2023-07-04 DIAGNOSIS — M50221 Other cervical disc displacement at C4-C5 level: Secondary | ICD-10-CM | POA: Diagnosis not present

## 2023-07-04 DIAGNOSIS — M542 Cervicalgia: Secondary | ICD-10-CM | POA: Insufficient documentation

## 2023-07-04 DIAGNOSIS — M50222 Other cervical disc displacement at C5-C6 level: Secondary | ICD-10-CM | POA: Diagnosis not present

## 2023-07-04 DIAGNOSIS — M4802 Spinal stenosis, cervical region: Secondary | ICD-10-CM | POA: Diagnosis not present

## 2023-07-04 DIAGNOSIS — M5023 Other cervical disc displacement, cervicothoracic region: Secondary | ICD-10-CM | POA: Diagnosis not present

## 2023-07-08 DIAGNOSIS — M542 Cervicalgia: Secondary | ICD-10-CM | POA: Diagnosis not present

## 2023-07-08 DIAGNOSIS — M5412 Radiculopathy, cervical region: Secondary | ICD-10-CM | POA: Diagnosis not present

## 2023-07-23 ENCOUNTER — Ambulatory Visit (HOSPITAL_COMMUNITY): Payer: Self-pay | Admitting: Orthopedic Surgery

## 2023-07-23 DIAGNOSIS — I10 Essential (primary) hypertension: Secondary | ICD-10-CM | POA: Diagnosis not present

## 2023-08-05 NOTE — Pre-Procedure Instructions (Signed)
Surgical Instructions   Your procedure is scheduled on August 11, 2023. Report to [facility] Main Entrance "A" at 5:30 A.M., then check in with the Admitting office. Any questions or running late day of surgery: call [phone]  Questions prior to your surgery date: call [phone], Monday-Friday, 8am-4pm. If you experience any cold or flu symptoms such as cough, fever, chills, shortness of breath, etc. between now and your scheduled surgery, please notify us at the above number.     Remember:  Do not eat after midnight the night before your surgery  You may drink clear liquids until 4:30 AM the morning of your surgery.   Clear liquids allowed are: Water, Non-Citrus Juices (without pulp), Carbonated Beverages, Clear Tea (no milk, honey, etc.), Black Coffee Only (NO MILK, CREAM OR POWDERED CREAMER of any kind), and Gatorade.    Take these medicines the morning of surgery with A SIP OF WATER: acetaminophen (TYLENOL) - may take if needed   One week prior to surgery, STOP taking any Aspirin (unless otherwise instructed by your surgeon) Aleve, Naproxen, Ibuprofen, Motrin, Advil, Goody's, BC's, all herbal medications, fish oil, and non-prescription vitamins.                     Do NOT Smoke (Tobacco/Vaping) for 24 hours prior to your procedure.  If you use a CPAP at night, you may bring your mask/headgear for your overnight stay.   You will be asked to remove any contacts, glasses, piercing's, hearing aid's, dentures/partials prior to surgery. Please bring cases for these items if needed.    Patients discharged the day of surgery will not be allowed to drive home, and someone needs to stay with them for 24 hours.  SURGICAL WAITING ROOM VISITATION Patients may have no more than 2 support people in the waiting area - these visitors may rotate.   Pre-op nurse will coordinate an appropriate time for 1 ADULT support person, who may not rotate, to accompany patient in pre-op.  Children  under the age of 5 must have an adult with them who is not the patient and must remain in the main waiting area with an adult.  If the patient needs to stay at the hospital during part of their recovery, the visitor guidelines for inpatient rooms apply.  Please refer to the [facility] website for the visitor guidelines for any additional information.   If you received a COVID test during your pre-op visit  it is requested that you wear a mask when out in public, stay away from anyone that may not be feeling well and notify your surgeon if you develop symptoms. If you have been in contact with anyone that has tested positive in the last 10 days please notify you surgeon.      Pre-operative 5 CHG Bathing Instructions   You can play a key role in reducing the risk of infection after surgery. Your skin needs to be as free of germs as possible. You can reduce the number of germs on your skin by washing with CHG (chlorhexidine gluconate) soap before surgery. CHG is an antiseptic soap that kills germs and continues to kill germs even after washing.   DO NOT use if you have an allergy to chlorhexidine/CHG or antibacterial soaps. If your skin becomes reddened or irritated, stop using the CHG and notify one of our RNs at [phone].   Please shower with the CHG soap starting 4 days before surgery using the following schedule:  Please keep in mind the following:  DO NOT shave, including legs and underarms, starting the day of your first shower.   You may shave your face at any point before/day of surgery.  Place clean sheets on your bed the day you start using CHG soap. Use a clean washcloth (not used since being washed) for each shower. DO NOT sleep with pets once you start using the CHG.   CHG Shower Instructions:  Wash your face and private area with normal soap. If you choose to wash your hair, wash first with your normal shampoo.  After you use shampoo/soap, rinse your hair and body  thoroughly to remove shampoo/soap residue.  Turn the water OFF and apply about 3 tablespoons (45 ml) of CHG soap to a CLEAN washcloth.  Apply CHG soap ONLY FROM YOUR NECK DOWN TO YOUR TOES (washing for 3-5 minutes)  DO NOT use CHG soap on face, private areas, open wounds, or sores.  Pay special attention to the area where your surgery is being performed.  If you are having back surgery, having someone wash your back for you may be helpful. Wait 2 minutes after CHG soap is applied, then you may rinse off the CHG soap.  Pat dry with a clean towel  Put on clean clothes/pajamas   If you choose to wear lotion, please use ONLY the CHG-compatible lotions on the back of this paper.   Additional instructions for the day of surgery: DO NOT APPLY any lotions, deodorants, cologne, or perfumes.   Do not bring valuables to the hospital. [facility] is not responsible for any belongings/valuables. Do not wear nail polish, gel polish, artificial nails, or any other type of covering on natural nails (fingers and toes) Do not wear jewelry or makeup Put on clean/comfortable clothes.  Please brush your teeth.  Ask your nurse before applying any prescription medications to the skin.     CHG Compatible Lotions   Aveeno Moisturizing lotion  Cetaphil Moisturizing Cream  Cetaphil Moisturizing Lotion  Clairol Herbal Essence Moisturizing Lotion, Dry Skin  Clairol Herbal Essence Moisturizing Lotion, Extra Dry Skin  Clairol Herbal Essence Moisturizing Lotion, Normal Skin  Curel Age Defying Therapeutic Moisturizing Lotion with Alpha Hydroxy  Curel Extreme Care Body Lotion  Curel Soothing Hands Moisturizing Hand Lotion  Curel Therapeutic Moisturizing Cream, Fragrance-Free  Curel Therapeutic Moisturizing Lotion, Fragrance-Free  Curel Therapeutic Moisturizing Lotion, Original Formula  Eucerin Daily Replenishing Lotion  Eucerin Dry Skin Therapy Plus Alpha Hydroxy Crme  Eucerin Dry Skin Therapy Plus Alpha  Hydroxy Lotion  Eucerin Original Crme  Eucerin Original Lotion  Eucerin Plus Crme Eucerin Plus Lotion  Eucerin TriLipid Replenishing Lotion  Keri Anti-Bacterial Hand Lotion  Keri Deep Conditioning Original Lotion Dry Skin Formula Softly Scented  Keri Deep Conditioning Original Lotion, Fragrance Free Sensitive Skin Formula  Keri Lotion Fast Absorbing Fragrance Free Sensitive Skin Formula  Keri Lotion Fast Absorbing Softly Scented Dry Skin Formula  Keri Original Lotion  Keri Skin Renewal Lotion Keri Silky Smooth Lotion  Keri Silky Smooth Sensitive Skin Lotion  Nivea Body Creamy Conditioning Oil  Nivea Body Extra Enriched Lotion  Nivea Body Original Lotion  Nivea Body Sheer Moisturizing Lotion Nivea Crme  Nivea Skin Firming Lotion  NutraDerm 30 Skin Lotion  NutraDerm Skin Lotion  NutraDerm Therapeutic Skin Cream  NutraDerm Therapeutic Skin Lotion  ProShield Protective Hand Cream  Provon moisturizing lotion  Please read over the following fact sheets that you were given.

## 2023-08-06 ENCOUNTER — Encounter (HOSPITAL_COMMUNITY): Payer: Self-pay

## 2023-08-06 ENCOUNTER — Other Ambulatory Visit: Payer: Self-pay

## 2023-08-06 ENCOUNTER — Encounter (HOSPITAL_COMMUNITY)
Admission: RE | Admit: 2023-08-06 | Discharge: 2023-08-06 | Disposition: A | Discharge status: Home / Self Care | Payer: BC Managed Care – PPO | Source: Ambulatory Visit | Attending: Orthopedic Surgery | Admitting: Orthopedic Surgery

## 2023-08-06 VITALS — BP 145/87 | HR 80 | Temp 98.2°F | Resp 17 | Ht 69.5 in | Wt 182.8 lb

## 2023-08-06 DIAGNOSIS — Z01818 Encounter for other preprocedural examination: Secondary | ICD-10-CM | POA: Diagnosis not present

## 2023-08-06 DIAGNOSIS — I2510 Atherosclerotic heart disease of native coronary artery without angina pectoris: Secondary | ICD-10-CM | POA: Diagnosis not present

## 2023-08-06 HISTORY — DX: Gastro-esophageal reflux disease without esophagitis: K21.9

## 2023-08-06 HISTORY — DX: Headache, unspecified: R51.9

## 2023-08-06 HISTORY — DX: Attention-deficit hyperactivity disorder, unspecified type: F90.9

## 2023-08-06 HISTORY — DX: Unspecified osteoarthritis, unspecified site: M19.90

## 2023-08-06 HISTORY — DX: Anxiety disorder, unspecified: F41.9

## 2023-08-06 LAB — BASIC METABOLIC PANEL
Anion gap: 10 (ref 5–15)
BUN: 8 mg/dL (ref 6–20)
CO2: 23 mmol/L (ref 22–32)
Calcium: 8.7 mg/dL — ABNORMAL LOW (ref 8.9–10.3)
Chloride: 103 mmol/L (ref 98–111)
Creatinine, Ser: 0.7 mg/dL (ref 0.61–1.24)
GFR, Estimated: >60 mL/min (ref >60)
Glucose, Bld: 100 mg/dL — ABNORMAL HIGH (ref 70–99)
Potassium: 3.8 mmol/L (ref 3.5–5.1)
Sodium: 136 mmol/L (ref 135–145)

## 2023-08-06 LAB — CBC
HCT: 43.8 % (ref 39.0–52.0)
Hemoglobin: 14.3 g/dL (ref 13.0–17.0)
MCH: 27.7 pg (ref 26.0–34.0)
MCHC: 32.6 g/dL (ref 30.0–36.0)
MCV: 84.9 fL (ref 80.0–100.0)
Platelets: 257 10*3/uL (ref 150–400)
RBC: 5.16 MIL/uL (ref 4.22–5.81)
RDW: 14.3 % (ref 11.5–15.5)
WBC: 10.8 10*3/uL — ABNORMAL HIGH (ref 4.0–10.5)
nRBC: 0 % (ref 0.0–0.2)

## 2023-08-06 LAB — SURGICAL PCR SCREEN
MRSA, PCR: NEGATIVE
Staphylococcus aureus: NEGATIVE

## 2023-08-06 LAB — NO BLOOD PRODUCTS

## 2023-08-06 NOTE — Progress Notes (Signed)
PCP - Pt could not remember name of PCP, but he is in the process of transitioning to [facility] in January 2025 Cardiologist - Denies  PPM/ICD - Denies Device Orders - n/a Rep Notified - n/a  Chest x-ray - n/a EKG - 08/06/2023 Stress Test - Denies ECHO - Denies Cardiac Cath - Denies  Sleep Study - Denies CPAP - n/a  No DM  Last dose of GLP1 agonist- n/a GLP1 instructions: n/a  Blood Thinner Instructions: n/a Aspirin Instructions: n/a  ERAS Protcol - Clear liquids until 0430 morning of surgery PRE-SURGERY Ensure or G2- n/a  COVID TEST- n/a   Anesthesia review: No.   Patient denies shortness of breath, fever, cough and chest pain at PAT appointment. Pt denies any respiratory illness/infection in the last two months.    All instructions explained to the patient, with a verbal understanding of the material. Patient agrees to go over the instructions while at home for a better understanding. Patient also instructed to self quarantine after being tested for COVID-19. The opportunity to ask questions was provided.

## 2023-08-08 DIAGNOSIS — M5412 Radiculopathy, cervical region: Secondary | ICD-10-CM | POA: Diagnosis not present

## 2023-08-10 NOTE — Anesthesia Preprocedure Evaluation (Signed)
Anesthesia Evaluation  Patient identified by MRN, date of birth, ID band Patient awake    Reviewed: Allergy & Precautions, NPO status , Patient's Chart, lab work & pertinent test results  Airway Mallampati: II  TM Distance: >3 FB Neck ROM: Full    Dental  (+) Edentulous Upper, Edentulous Lower, Dental Advisory Given   Pulmonary Patient abstained from smoking., former smoker   Pulmonary exam normal breath sounds clear to auscultation       Cardiovascular hypertension, Normal cardiovascular exam Rhythm:Regular Rate:Normal     Neuro/Psych  Headaches PSYCHIATRIC DISORDERS Anxiety        GI/Hepatic ,GERD  ,,(+)     substance abuse  marijuana use  Endo/Other  negative endocrine ROS    Renal/GU negative Renal ROS  negative genitourinary   Musculoskeletal negative musculoskeletal ROS (+)    Abdominal   Peds  Hematology  (+) REFUSES BLOOD PRODUCTS  Anesthesia Other Findings   Reproductive/Obstetrics                             Anesthesia Physical Anesthesia Plan  ASA: 2  Anesthesia Plan: General   Post-op Pain Management: Ketamine IV* and Tylenol PO (pre-op)*   Induction: Intravenous  PONV Risk Score and Plan: 2 and Midazolam, Dexamethasone and Ondansetron  Airway Management Planned: Oral ETT  Additional Equipment: Arterial line  Intra-op Plan:   Post-operative Plan: Extubation in OR  Informed Consent: I have reviewed the patients History and Physical, chart, labs and discussed the procedure including the risks, benefits and alternatives for the proposed anesthesia with the patient or authorized representative who has indicated his/her understanding and acceptance.     Dental advisory given  Plan Discussed with: CRNA  Anesthesia Plan Comments: (2 IVs)       Anesthesia Quick Evaluation

## 2023-08-11 ENCOUNTER — Ambulatory Visit (HOSPITAL_COMMUNITY): Payer: Self-pay | Admitting: Anesthesiology

## 2023-08-11 ENCOUNTER — Ambulatory Visit (HOSPITAL_COMMUNITY): Payer: BC Managed Care – PPO

## 2023-08-11 ENCOUNTER — Other Ambulatory Visit: Payer: Self-pay

## 2023-08-11 ENCOUNTER — Encounter (HOSPITAL_COMMUNITY): Payer: Self-pay | Admitting: Orthopedic Surgery

## 2023-08-11 ENCOUNTER — Observation Stay (HOSPITAL_COMMUNITY)
Admission: RE | Admit: 2023-08-11 | Discharge: 2023-08-12 | Disposition: A | Discharge status: Home / Self Care | Payer: BC Managed Care – PPO | Attending: Orthopedic Surgery | Admitting: Orthopedic Surgery

## 2023-08-11 ENCOUNTER — Encounter (HOSPITAL_COMMUNITY)
Admission: RE | Disposition: A | Discharge status: Home / Self Care | Payer: Self-pay | Source: Home / Self Care | Attending: Orthopedic Surgery

## 2023-08-11 DIAGNOSIS — Z981 Arthrodesis status: Secondary | ICD-10-CM | POA: Diagnosis not present

## 2023-08-11 DIAGNOSIS — M4723 Other spondylosis with radiculopathy, cervicothoracic region: Secondary | ICD-10-CM | POA: Diagnosis not present

## 2023-08-11 DIAGNOSIS — I10 Essential (primary) hypertension: Secondary | ICD-10-CM | POA: Insufficient documentation

## 2023-08-11 DIAGNOSIS — Z79899 Other long term (current) drug therapy: Secondary | ICD-10-CM | POA: Insufficient documentation

## 2023-08-11 DIAGNOSIS — M4722 Other spondylosis with radiculopathy, cervical region: Secondary | ICD-10-CM | POA: Diagnosis not present

## 2023-08-11 DIAGNOSIS — M5412 Radiculopathy, cervical region: Secondary | ICD-10-CM | POA: Diagnosis not present

## 2023-08-11 DIAGNOSIS — M4322 Fusion of spine, cervical region: Principal | ICD-10-CM | POA: Diagnosis present

## 2023-08-11 HISTORY — PX: ANTERIOR CERVICAL DECOMPRESSION/DISCECTOMY FUSION 4 LEVELS: SHX5556

## 2023-08-11 SURGERY — ANTERIOR CERVICAL DECOMPRESSION/DISCECTOMY FUSION 4 LEVELS
Anesthesia: General

## 2023-08-11 MED ORDER — KETAMINE HCL 50 MG/5ML IJ SOSY
PREFILLED_SYRINGE | INTRAMUSCULAR | Status: DC | PRN
  Administered 2023-08-11 (×2): 25 mg via INTRAVENOUS

## 2023-08-11 MED ORDER — THROMBIN 20000 UNITS EX SOLR
CUTANEOUS | Status: AC
  Filled 2023-08-11: qty 20000

## 2023-08-11 MED ORDER — PHENYLEPHRINE HCL-NACL 20-0.9 MG/250ML-% IV SOLN
INTRAVENOUS | Status: DC | PRN
  Administered 2023-08-11: 30 ug/min via INTRAVENOUS

## 2023-08-11 MED ORDER — METHOCARBAMOL 500 MG PO TABS: 500.0000 mg | ORAL_TABLET | Freq: Three times a day (TID) | ORAL | 0 refills | Status: AC | PRN

## 2023-08-11 MED ORDER — ORAL CARE MOUTH RINSE: 15.0000 mL | Freq: Once | OROMUCOSAL | Status: AC

## 2023-08-11 MED ORDER — SODIUM CHLORIDE 0.9% FLUSH
3.0000 mL | Freq: Two times a day (BID) | INTRAVENOUS | Status: DC
  Administered 2023-08-11 (×2): 3 mL via INTRAVENOUS

## 2023-08-11 MED ORDER — CHLORHEXIDINE GLUCONATE 0.12 % MT SOLN
15.0000 mL | Freq: Once | OROMUCOSAL | Status: AC
  Administered 2023-08-11: 15 mL via OROMUCOSAL
  Filled 2023-08-11: qty 15

## 2023-08-11 MED ORDER — LACTATED RINGERS IV SOLN: INTRAVENOUS | Status: DC | PRN

## 2023-08-11 MED ORDER — LACTATED RINGERS IV SOLN: INTRAVENOUS | Status: DC

## 2023-08-11 MED ORDER — 0.9 % SODIUM CHLORIDE (POUR BTL) OPTIME
TOPICAL | Status: DC | PRN
  Administered 2023-08-11: 1000 mL

## 2023-08-11 MED ORDER — LIDOCAINE 2% (20 MG/ML) 5 ML SYRINGE
INTRAMUSCULAR | Status: DC | PRN
  Administered 2023-08-11: 100 mg via INTRAVENOUS

## 2023-08-11 MED ORDER — CEFAZOLIN SODIUM-DEXTROSE 2-4 GM/100ML-% IV SOLN
2.0000 g | INTRAVENOUS | Status: AC
  Administered 2023-08-11: 2 g via INTRAVENOUS
  Filled 2023-08-11: qty 100

## 2023-08-11 MED ORDER — PHENYLEPHRINE 80 MCG/ML (10ML) SYRINGE FOR IV PUSH (FOR BLOOD PRESSURE SUPPORT)
PREFILLED_SYRINGE | INTRAVENOUS | Status: DC | PRN
  Administered 2023-08-11 (×5): 80 ug via INTRAVENOUS

## 2023-08-11 MED ORDER — SUGAMMADEX SODIUM 200 MG/2ML IV SOLN
INTRAVENOUS | Status: DC | PRN
  Administered 2023-08-11: 200 mg via INTRAVENOUS

## 2023-08-11 MED ORDER — HYDROMORPHONE HCL 1 MG/ML IJ SOLN
1.0000 mg | INTRAMUSCULAR | Status: DC | PRN
  Administered 2023-08-11 – 2023-08-12 (×3): 1 mg via INTRAVENOUS
  Filled 2023-08-11 (×3): qty 1

## 2023-08-11 MED ORDER — DEXAMETHASONE SODIUM PHOSPHATE 10 MG/ML IJ SOLN
INTRAMUSCULAR | Status: DC | PRN
  Administered 2023-08-11: 10 mg via INTRAVENOUS

## 2023-08-11 MED ORDER — KETAMINE HCL 50 MG/5ML IJ SOSY
PREFILLED_SYRINGE | INTRAMUSCULAR | Status: AC
  Filled 2023-08-11: qty 5

## 2023-08-11 MED ORDER — HYDROMORPHONE HCL 1 MG/ML IJ SOLN
INTRAMUSCULAR | Status: AC
  Filled 2023-08-11: qty 0.5

## 2023-08-11 MED ORDER — BUPIVACAINE-EPINEPHRINE 0.25% -1:200000 IJ SOLN
INTRAMUSCULAR | Status: DC | PRN
  Administered 2023-08-11: 10 mL

## 2023-08-11 MED ORDER — FENTANYL CITRATE (PF) 100 MCG/2ML IJ SOLN
25.0000 ug | INTRAMUSCULAR | Status: DC | PRN
  Administered 2023-08-11 (×2): 50 ug via INTRAVENOUS

## 2023-08-11 MED ORDER — BISACODYL 5 MG PO TBEC
5.0000 mg | DELAYED_RELEASE_TABLET | Freq: Every day | ORAL | Status: DC | PRN
Start: 2023-08-11 — End: 2023-08-12
  Administered 2023-08-11: 5 mg via ORAL
  Filled 2023-08-11: qty 1

## 2023-08-11 MED ORDER — METHOCARBAMOL 500 MG PO TABS
500.0000 mg | ORAL_TABLET | Freq: Four times a day (QID) | ORAL | Status: DC | PRN
  Administered 2023-08-11 – 2023-08-12 (×3): 500 mg via ORAL
  Filled 2023-08-11 (×3): qty 1

## 2023-08-11 MED ORDER — ALUM & MAG HYDROXIDE-SIMETH 200-200-20 MG/5ML PO SUSP: 30.0000 mL | Freq: Four times a day (QID) | ORAL | Status: DC | PRN

## 2023-08-11 MED ORDER — ACETAMINOPHEN 650 MG RE SUPP: 650.0000 mg | RECTAL | Status: DC | PRN

## 2023-08-11 MED ORDER — ONDANSETRON HCL 4 MG/2ML IJ SOLN
4.0000 mg | Freq: Four times a day (QID) | INTRAMUSCULAR | Status: DC | PRN
Start: 2023-08-11 — End: 2023-08-12
  Administered 2023-08-12: 4 mg via INTRAVENOUS
  Filled 2023-08-11: qty 2

## 2023-08-11 MED ORDER — FENTANYL CITRATE (PF) 100 MCG/2ML IJ SOLN
INTRAMUSCULAR | Status: AC
  Administered 2023-08-11: 50 ug
  Filled 2023-08-11: qty 2

## 2023-08-11 MED ORDER — CEFAZOLIN SODIUM-DEXTROSE 2-3 GM-%(50ML) IV SOLR
INTRAVENOUS | Status: DC | PRN
  Administered 2023-08-11: 2 g via INTRAVENOUS

## 2023-08-11 MED ORDER — FENTANYL CITRATE (PF) 250 MCG/5ML IJ SOLN
INTRAMUSCULAR | Status: DC | PRN
  Administered 2023-08-11 (×5): 50 ug via INTRAVENOUS

## 2023-08-11 MED ORDER — ALUM & MAG HYDROXIDE-SIMETH 200-200-20 MG/5ML PO SUSP
30.0000 mL | Freq: Four times a day (QID) | ORAL | Status: DC | PRN
  Administered 2023-08-11: 30 mL via ORAL
  Filled 2023-08-11: qty 30

## 2023-08-11 MED ORDER — ACETAMINOPHEN 325 MG PO TABS: 650.0000 mg | ORAL_TABLET | ORAL | Status: DC | PRN

## 2023-08-11 MED ORDER — PROPOFOL 10 MG/ML IV BOLUS
INTRAVENOUS | Status: DC | PRN
  Administered 2023-08-11: 50 mg via INTRAVENOUS
  Administered 2023-08-11: 150 mg via INTRAVENOUS

## 2023-08-11 MED ORDER — OXYCODONE-ACETAMINOPHEN 10-325 MG PO TABS: 1.0000 | ORAL_TABLET | Freq: Four times a day (QID) | ORAL | 0 refills | Status: AC | PRN

## 2023-08-11 MED ORDER — DEXAMETHASONE 4 MG PO TABS
4.0000 mg | ORAL_TABLET | Freq: Four times a day (QID) | ORAL | Status: DC
  Administered 2023-08-11 – 2023-08-12 (×3): 4 mg via ORAL
  Filled 2023-08-11 (×3): qty 1

## 2023-08-11 MED ORDER — PHENOL 1.4 % MT LIQD
1.0000 | OROMUCOSAL | Status: DC | PRN
  Administered 2023-08-12: 1 via OROMUCOSAL
  Filled 2023-08-11: qty 177

## 2023-08-11 MED ORDER — CEFAZOLIN SODIUM-DEXTROSE 1-4 GM/50ML-% IV SOLN
1.0000 g | Freq: Three times a day (TID) | INTRAVENOUS | Status: AC
  Administered 2023-08-11 – 2023-08-12 (×2): 1 g via INTRAVENOUS
  Filled 2023-08-11 (×2): qty 50

## 2023-08-11 MED ORDER — PANTOPRAZOLE SODIUM 40 MG PO TBEC
40.0000 mg | DELAYED_RELEASE_TABLET | Freq: Every day | ORAL | Status: DC
  Administered 2023-08-11: 40 mg via ORAL
  Filled 2023-08-11: qty 1

## 2023-08-11 MED ORDER — SODIUM CHLORIDE 0.9 % IV SOLN: 250.0000 mL | INTRAVENOUS | Status: DC

## 2023-08-11 MED ORDER — DEXAMETHASONE SODIUM PHOSPHATE 4 MG/ML IJ SOLN: 4.0000 mg | Freq: Four times a day (QID) | INTRAMUSCULAR | Status: DC

## 2023-08-11 MED ORDER — TRANEXAMIC ACID-NACL 1000-0.7 MG/100ML-% IV SOLN
1000.0000 mg | INTRAVENOUS | Status: AC
  Administered 2023-08-11: 1000 mg via INTRAVENOUS
  Filled 2023-08-11: qty 100

## 2023-08-11 MED ORDER — THROMBIN 20000 UNITS EX SOLR
CUTANEOUS | Status: DC | PRN
  Administered 2023-08-11: 20 mL via TOPICAL

## 2023-08-11 MED ORDER — HYDROMORPHONE HCL 1 MG/ML IJ SOLN
INTRAMUSCULAR | Status: DC | PRN
Start: 2023-08-11 — End: 2023-08-11
  Administered 2023-08-11: .5 mg via INTRAVENOUS

## 2023-08-11 MED ORDER — FENTANYL CITRATE (PF) 250 MCG/5ML IJ SOLN
INTRAMUSCULAR | Status: AC
  Filled 2023-08-11: qty 5

## 2023-08-11 MED ORDER — ACETAMINOPHEN 500 MG PO TABS
1000.0000 mg | ORAL_TABLET | Freq: Once | ORAL | Status: AC
  Administered 2023-08-11: 1000 mg via ORAL
  Filled 2023-08-11: qty 2

## 2023-08-11 MED ORDER — OXYCODONE HCL 5 MG PO TABS: 5.0000 mg | ORAL_TABLET | ORAL | Status: DC | PRN

## 2023-08-11 MED ORDER — DOCUSATE SODIUM 100 MG PO CAPS
100.0000 mg | ORAL_CAPSULE | Freq: Two times a day (BID) | ORAL | Status: DC
Start: 2023-08-11 — End: 2023-08-12
  Administered 2023-08-11: 100 mg via ORAL
  Filled 2023-08-11: qty 1

## 2023-08-11 MED ORDER — MAGNESIUM CITRATE PO SOLN
1.0000 | Freq: Once | ORAL | Status: AC | PRN
  Administered 2023-08-12: 1 via ORAL
  Filled 2023-08-11: qty 296

## 2023-08-11 MED ORDER — CALCIUM CARBONATE ANTACID 500 MG PO CHEW
1.0000 | CHEWABLE_TABLET | Freq: Three times a day (TID) | ORAL | Status: DC | PRN
  Administered 2023-08-11: 200 mg via ORAL
  Filled 2023-08-11: qty 1

## 2023-08-11 MED ORDER — PROPOFOL 10 MG/ML IV BOLUS
INTRAVENOUS | Status: AC
  Filled 2023-08-11: qty 20

## 2023-08-11 MED ORDER — DEXMEDETOMIDINE HCL IN NACL 80 MCG/20ML IV SOLN
INTRAVENOUS | Status: DC | PRN
  Administered 2023-08-11: 8 ug via INTRAVENOUS
  Administered 2023-08-11: 4 ug via INTRAVENOUS

## 2023-08-11 MED ORDER — MIDAZOLAM HCL 2 MG/2ML IJ SOLN
INTRAMUSCULAR | Status: DC | PRN
  Administered 2023-08-11: 2 mg via INTRAVENOUS

## 2023-08-11 MED ORDER — ONDANSETRON HCL 4 MG PO TABS: 4.0000 mg | ORAL_TABLET | Freq: Three times a day (TID) | ORAL | 0 refills | Status: AC | PRN

## 2023-08-11 MED ORDER — POLYETHYLENE GLYCOL 3350 17 G PO PACK: 17.0000 g | PACK | Freq: Every day | ORAL | Status: DC | PRN

## 2023-08-11 MED ORDER — OXYCODONE HCL 5 MG PO TABS
10.0000 mg | ORAL_TABLET | ORAL | Status: DC | PRN
  Administered 2023-08-11 – 2023-08-12 (×5): 10 mg via ORAL
  Filled 2023-08-11 (×5): qty 2

## 2023-08-11 MED ORDER — MENTHOL 3 MG MT LOZG
1.0000 | LOZENGE | OROMUCOSAL | Status: DC | PRN
  Administered 2023-08-11: 3 mg via ORAL
  Filled 2023-08-11: qty 9

## 2023-08-11 MED ORDER — ROCURONIUM BROMIDE 10 MG/ML (PF) SYRINGE
PREFILLED_SYRINGE | INTRAVENOUS | Status: DC | PRN
  Administered 2023-08-11 (×5): 50 mg via INTRAVENOUS

## 2023-08-11 MED ORDER — BUPIVACAINE-EPINEPHRINE (PF) 0.25% -1:200000 IJ SOLN
INTRAMUSCULAR | Status: AC
  Filled 2023-08-11: qty 30

## 2023-08-11 MED ORDER — SODIUM CHLORIDE 0.9% FLUSH: 3.0000 mL | INTRAVENOUS | Status: DC | PRN

## 2023-08-11 MED ORDER — METHOCARBAMOL 1000 MG/10ML IJ SOLN: 500.0000 mg | Freq: Four times a day (QID) | INTRAMUSCULAR | Status: DC | PRN

## 2023-08-11 MED ORDER — MIDAZOLAM HCL 2 MG/2ML IJ SOLN
INTRAMUSCULAR | Status: AC
  Filled 2023-08-11: qty 2

## 2023-08-11 MED ORDER — ONDANSETRON HCL 4 MG/2ML IJ SOLN
INTRAMUSCULAR | Status: DC | PRN
  Administered 2023-08-11: 4 mg via INTRAVENOUS

## 2023-08-11 MED ORDER — ONDANSETRON HCL 4 MG PO TABS: 4.0000 mg | ORAL_TABLET | Freq: Four times a day (QID) | ORAL | Status: DC | PRN

## 2023-08-11 SURGICAL SUPPLY — 64 items
BAG COUNTER SPONGE SURGICOUNT (BAG) ×1 IMPLANT
BAND RUBBER #18 3X1/16 STRL (MISCELLANEOUS) IMPLANT
BLADE CLIPPER SURG (BLADE) IMPLANT
CABLE BIPOLOR RESECTION CORD (MISCELLANEOUS) ×1 IMPLANT
CANISTER SUCT 3000ML PPV (MISCELLANEOUS) ×1 IMPLANT
CLSR STERI-STRIP ANTIMIC 1/2X4 (GAUZE/BANDAGES/DRESSINGS) ×1 IMPLANT
COLLAR CERV LO CONTOUR FIRM DE (SOFTGOODS) IMPLANT
COVER MAYO STAND STRL (DRAPES) ×2 IMPLANT
COVER SURGICAL LIGHT HANDLE (MISCELLANEOUS) ×2 IMPLANT
DRAIN CHANNEL 15F RND FF W/TCR (WOUND CARE) IMPLANT
DRAPE C-ARM 42X72 X-RAY (DRAPES) ×1 IMPLANT
DRAPE POUCH INSTRU U-SHP 10X18 (DRAPES) IMPLANT
DRAPE SURG 17X23 STRL (DRAPES) ×1 IMPLANT
DRAPE U-SHAPE 47X51 STRL (DRAPES) ×1 IMPLANT
DRSG OPSITE POSTOP 4X6 (GAUZE/BANDAGES/DRESSINGS) ×1 IMPLANT
DURAPREP 26ML APPLICATOR (WOUND CARE) ×1 IMPLANT
ELECT COATED BLADE 2.86 ST (ELECTRODE) ×1 IMPLANT
ELECT PENCIL ROCKER SW 15FT (MISCELLANEOUS) ×1 IMPLANT
ELECT REM PT RETURN 9FT ADLT (ELECTROSURGICAL) ×1
ELECTRODE REM PT RTRN 9FT ADLT (ELECTROSURGICAL) ×1 IMPLANT
GLOVE BIO SURGEON STRL SZ 6.5 (GLOVE) ×1 IMPLANT
GLOVE BIOGEL PI IND STRL 6.5 (GLOVE) ×1 IMPLANT
GLOVE BIOGEL PI IND STRL 8.5 (GLOVE) ×1 IMPLANT
GLOVE SS BIOGEL STRL SZ 8.5 (GLOVE) ×1 IMPLANT
GOWN STRL REIN 3XL LVL4 (GOWN DISPOSABLE) IMPLANT
GOWN STRL REUS W/TWL 2XL LVL3 (GOWN DISPOSABLE) ×1 IMPLANT
GOWN STRL SURGICAL XL XLNG (GOWN DISPOSABLE) IMPLANT
HANDLE YANKAUER SUCT OPEN TIP (MISCELLANEOUS) IMPLANT
KIT BASIN OR (CUSTOM PROCEDURE TRAY) ×1 IMPLANT
KIT TURNOVER KIT B (KITS) ×1 IMPLANT
NDL SPNL 18GX3.5 QUINCKE PK (NEEDLE) ×1 IMPLANT
NEEDLE SPNL 18GX3.5 QUINCKE PK (NEEDLE) ×1 IMPLANT
NS IRRIG 1000ML POUR BTL (IV SOLUTION) ×1 IMPLANT
PACK ORTHO CERVICAL (CUSTOM PROCEDURE TRAY) ×1 IMPLANT
PACK UNIVERSAL I (CUSTOM PROCEDURE TRAY) ×1 IMPLANT
PAD ARMBOARD 7.5X6 YLW CONV (MISCELLANEOUS) ×2 IMPLANT
PATTIES SURGICAL .5 X.5 (GAUZE/BANDAGES/DRESSINGS) IMPLANT
PENCIL BUTTON HOLSTER BLD 10FT (ELECTRODE) IMPLANT
PIN DISTRATION 14MM (PIN) IMPLANT
POSITIONER HEAD DONUT 9IN (MISCELLANEOUS) ×1 IMPLANT
PUTTY BONE DBX 5CC MIX (Putty) IMPLANT
PUTTY DBX 2.5CC (Putty) ×1 IMPLANT
PUTTY DBX 2.5CC DEPUY (Putty) IMPLANT
REVEL-S 14 X 16MM 7 DEGREE (Cage) ×3 IMPLANT
SCREW SELF DRILL 3.6MM14MM (Screw) IMPLANT
SCREW SELF DRILL 4.2MM14MM (Screw) IMPLANT
SPACER REVEL-S 14X16 7D (Cage) IMPLANT
SPONGE INTESTINAL PEANUT (DISPOSABLE) ×1 IMPLANT
SPONGE SURGIFOAM ABS GEL 100 (HEMOSTASIS) ×1 IMPLANT
SPONGE T-LAP 4X18 ~~LOC~~+RFID (SPONGE) ×2 IMPLANT
SURGIFLO W/THROMBIN 8M KIT (HEMOSTASIS) ×1 IMPLANT
SUT BONE WAX W31G (SUTURE) ×1 IMPLANT
SUT MNCRL AB 3-0 PS2 27 (SUTURE) ×1 IMPLANT
SUT SILK 2 0 TIES 10X30 (SUTURE) IMPLANT
SUT VIC AB 2-0 CT1 18 (SUTURE) ×1 IMPLANT
SUT VIC AB 3-0 54X BRD REEL (SUTURE) ×1 IMPLANT
SYR BULB IRRIG 60ML STRL (SYRINGE) ×1 IMPLANT
SYR CONTROL 10ML LL (SYRINGE) ×1 IMPLANT
TAPE CLOTH 4X10 WHT NS (GAUZE/BANDAGES/DRESSINGS) ×1 IMPLANT
TAPE UMBILICAL 1/8X30 (MISCELLANEOUS) ×1 IMPLANT
TOWEL GREEN STERILE (TOWEL DISPOSABLE) ×1 IMPLANT
TOWEL GREEN STERILE FF (TOWEL DISPOSABLE) ×1 IMPLANT
TRAY FOLEY MTR SLVR 16FR STAT (SET/KITS/TRAYS/PACK) IMPLANT
WATER STERILE IRR 1000ML POUR (IV SOLUTION) ×1 IMPLANT

## 2023-08-11 NOTE — Discharge Instructions (Signed)

## 2023-08-11 NOTE — Plan of Care (Signed)

## 2023-08-11 NOTE — Anesthesia Procedure Notes (Signed)
Arterial Line Insertion Start/End12/23/2024 7:35 AM, 08/11/2023 7:38 AM Performed by: Randon Goldsmith, CRNA, CRNA  Preanesthetic checklist: patient identified, IV checked, site marked, risks and benefits discussed, surgical consent, monitors and equipment checked, pre-op evaluation and timeout performed Lidocaine 1% used for infiltration Left, radial was placed Catheter size: 20 G Hand hygiene performed , maximum sterile barriers used  and Seldinger technique used Allen's test indicative of satisfactory collateral circulation Attempts: 1 Procedure performed without using ultrasound guided technique. Following insertion, dressing applied and Biopatch. Patient tolerated the procedure well with no immediate complications.

## 2023-08-11 NOTE — Brief Op Note (Signed)
08/11/2023  1:33 PM  PATIENT:  Jeannene Patella  47 y.o. male  PRE-OPERATIVE DIAGNOSIS:  Cervical radiculopathy C6, C7 and C8  POST-OPERATIVE DIAGNOSIS:  * No post-op diagnosis entered *  PROCEDURE:  Procedure(s): ANTERIOR CERVICAL DECOMPRESSION/DISCECTOMY FUSION 4 LEVELS Cervical Five-Six, Six-Seven, & Cervical Seven to Thoracic one (N/A)  SURGEON:  Surgeons and Role:    Venita Lick, MD - Primary  PHYSICIAN ASSISTANT:   ASSISTANTS: Luther Bradley   ANESTHESIA:   general  EBL:  75 mL   BLOOD ADMINISTERED:none  DRAINS: none   LOCAL MEDICATIONS USED:  MARCAINE     SPECIMEN:  No Specimen  DISPOSITION OF SPECIMEN:  N/A  COUNTS:  YES  TOURNIQUET:  * No tourniquets in log *  DICTATION: .Dragon Dictation  PLAN OF CARE: Admit for overnight observation  PATIENT DISPOSITION:  PACU - hemodynamically stable.

## 2023-08-11 NOTE — Op Note (Signed)
OPERATIVE REPORT  DATE OF SURGERY: 08/11/2023  PATIENT NAME:  [name] MRN: [id number] DOB: Apr 11, 1976  PCP: Patient, No Pcp Per  PRE-OPERATIVE DIAGNOSIS: Cervical spondylitic radiculopathy C5-T1.  POST-OPERATIVE DIAGNOSIS: Same  PROCEDURE:   ACDF C5-T1  SURGEON:  Venita Lick, MD  PHYSICIAN ASSISTANT: Luther Bradley  ANESTHESIA:   General  EBL: 75 ml   Complications: None  Implants: Globus Revel-S expandable 0 profile cage.  6 x 14 6 mm height.  7 degree lordosis.  Cage used at each level.  Cages were expanded to the proper final height.  Graft: DBX mix  BRIEF HISTORY: [name] is a 47 y.o. male who presented to my office with complaints of severe neck and radicular left arm pain.  Patient shows signs of cervical spondylitic radiculopathy in the C6, C7, and C8 dermatome.  Attempts at conservative management had failed to alleviate his pain or improve his quality of life.  As result we elected to move forward with surgery.  All appropriate risks, benefits, and alternatives were discussed with the patient and consent was obtained.  PROCEDURE DETAILS: Patient was brought into the operating room and was properly positioned on the operating room table.  After induction with general anesthesia the patient was endotracheally intubated.  A timeout was taken to confirm all important data: including patient, procedure, and the level. [name], [name] were applied.   A Foley was placed by the nurse and the anterior cervical spine was prepped and draped in a standard fashion.  Using fluoroscopy I marked out the C5-6 and the C7 and T1 levels.  I then marked out my longitudinal incision which I then infiltrated with quarter percent Marcaine with epinephrine.  A longitudinal incision was made on the left side and sharp dissection was carried out down to and through the platysma.  I continued to sharply dissect in the avascular plane sweeping the esophagus and trachea towards the right.  I  identified and isolated the omohyoid muscle and sacrificed it for visualization.  This was a standard Smith-Robinson approach to the anterior cervical spine.  Once the entire anterior longitudinal ligament was exposed from C5-T1 I placed a needle into the C5-6 disc space.  Intraoperative fluoroscopy was used to confirm that I was at the appropriate level.  Once this was done I marked the disc space with a Bovie as well as marked the C6-7 and C7-T1 levels.  I now mobilized the longus coli muscle from the mid body of C5 to the mid body of T1.  Caspar retractor blades were placed underneath the longus coli muscle and the endotracheal cuff was deflated and I expanded the retractor to the appropriate width.  The endotracheal cuff was deflated prior to expanding the retractor blades and it was reinflated after they were set in place.  I now had excellent visualization of the C7-T1 level.  Annulotomy was performed and I used the bulk of the disc material pituitary rongeurs.  I continued using a curette to remove the disc space and worked my way back posteriorly.  Traction pins were then placed into the body of C7 and T1 and I distracted the intervertebral space and maintained that with the distraction pin set.  Once I was at the posterior annulus I used a #1 Kerrison rongeur to trim down the posterior osteophyte.  I then used my fine nerve hook to begin dissecting through the posterior annulus and posterior longitudinal ligament.  Small fragments of disc material were removed from the left  posterior lateral corner consistent with what was seen on the preoperative MRI.  I was now able to identify the PLL and create a plane between the thecal sac and the PLL.  Using my 1 mm Kerrison rongeur I resected the PLL.  I then undercut the uncovertebral joints.  At this point I was quite pleased with the overall decompression/discectomy.  Under live fluoroscopy I was able to pass my nerve hook under the uncovertebral joints  bilaterally and underneath the posterior aspect of the vertebral bodies.  With a lamina spreader I was able to demonstrate parallel endplate distraction.  At this point I rasped the endplates and then used my trial implants to determine the best size.  The expandable medium cage was obtained and packed with DBX mix.  It was then gently malleted into place and it was expanded to a final height of approximately 7.5 mm.  A single screw was placed through the cage and into the T1 vertebral body to maintain its position.  With this level complete I moved my attention to the C5-6 level.  Using the exact same technique I isolated the C5-6 disc space and performed an annulotomy.  I did reposition the retractor blades to better expose this level.  Annulotomy was performed and using the exact same technique I removed all of the disc material at this level.  Using a small nerve hook I began dissecting centrally and was able to remove a central disc herniation that was consistent with what was seen on the preoperative MRI.  I then developed a plane underneath the posterior longitudinal ligament and resected this with a 1 mm Kerrison rongeur.  I then trimmed down the posterior osteophyte and performed a decompression the uncovertebral joints.  I again confirmed parallel endplate distraction with live fluoroscopy and I confirmed I could pass my nerve hook under the uncovertebral joints in the vertebral bodies bilaterally.  At this point the decompression/discectomy proved satisfactory I rasped the endplates and placed my trial and used the same size implant.  Once the implant was settled in position I then secured it directly into the body of C5 with the 14 mm length screw.  I repositioned the retractor blades to better expose the C6-7 level and using the exact same technique I performed a discectomy at C6-7.  I again removed all of the disc material and trim down the posterior disc from the vertebral body with a 1 mm  Kerrison rongeur.  The uncovertebral joints were also decompressed.  I again developed a plane underneath the posterior annulus and posterior longitudinal ligament with my nerve hook and resected it with a 1 mm Kerrison rongeur.  I confirmed with fluoroscopy parallel endplate distraction and adequate decompression.  I rasped the endplates.  I placed the same size implant.  I then expanded the implants to their final height.  All 3 endplates had excellent contact with the endplates.  Screws were then placed 14 mm length through the cage into the vertebral body.  Each cage had 2 screws.  Once all 6 screws were in position I then locked the screws to prevent them from backing out according manufacture standards.  The wounds were now copiously irrigated with normal saline and I confirmed hemostasis.  Distraction pins were removed and the trach and esophagus were returned to midline.  The platysma was then closed with interrupted 2-0 Vicryl suture, and the skin was closed with a 3-0 Monocryl.  Steri-Strips and a dry dressing were applied.  Patient  was ultimately extubated transfer the PACU without incident.  The end of the case all needle sponge counts were correct.  There were no adverse intraoperative events.  Venita Lick, MD 08/11/2023 1:20 PM

## 2023-08-11 NOTE — Plan of Care (Signed)
Problem: Education: Goal: Knowledge of General Education information will improve Description: Including pain rating scale, medication(s)/side effects and non-pharmacologic comfort measures 08/11/2023 1935 by Vincente Liberty, RN Outcome: Progressing 08/11/2023 1934 by Vincente Liberty, RN Outcome: Progressing   Problem: Health Behavior/Discharge Planning: Goal: Ability to manage health-related needs will improve 08/11/2023 1935 by Vincente Liberty, RN Outcome: Progressing 08/11/2023 1934 by Vincente Liberty, RN Outcome: Progressing   Problem: Clinical Measurements: Goal: Ability to maintain clinical measurements within normal limits will improve 08/11/2023 1935 by Vincente Liberty, RN Outcome: Progressing 08/11/2023 1934 by Vincente Liberty, RN Outcome: Progressing Goal: Will remain free from infection 08/11/2023 1935 by Vincente Liberty, RN Outcome: Progressing 08/11/2023 1934 by Vincente Liberty, RN Outcome: Progressing Goal: Diagnostic test results will improve 08/11/2023 1935 by Vincente Liberty, RN Outcome: Progressing 08/11/2023 1934 by Vincente Liberty, RN Outcome: Progressing Goal: Respiratory complications will improve 08/11/2023 1935 by Vincente Liberty, RN Outcome: Progressing 08/11/2023 1934 by Vincente Liberty, RN Outcome: Progressing Goal: Cardiovascular complication will be avoided 08/11/2023 1935 by Vincente Liberty, RN Outcome: Progressing 08/11/2023 1934 by Vincente Liberty, RN Outcome: Progressing   Problem: Activity: Goal: Risk for activity intolerance will decrease 08/11/2023 1935 by Vincente Liberty, RN Outcome: Progressing 08/11/2023 1934 by Vincente Liberty, RN Outcome: Progressing   Problem: Nutrition: Goal: Adequate nutrition will be maintained 08/11/2023 1935 by Vincente Liberty, RN Outcome: Progressing 08/11/2023 1934 by Vincente Liberty, RN Outcome: Progressing   Problem: Coping: Goal: Level of anxiety will decrease 08/11/2023  1935 by Vincente Liberty, RN Outcome: Progressing 08/11/2023 1934 by Vincente Liberty, RN Outcome: Progressing   Problem: Elimination: Goal: Will not experience complications related to bowel motility 08/11/2023 1935 by Vincente Liberty, RN Outcome: Progressing 08/11/2023 1934 by Vincente Liberty, RN Outcome: Progressing Goal: Will not experience complications related to urinary retention 08/11/2023 1935 by Vincente Liberty, RN Outcome: Progressing 08/11/2023 1934 by Vincente Liberty, RN Outcome: Progressing   Problem: Pain Management: Goal: General experience of comfort will improve 08/11/2023 1935 by Vincente Liberty, RN Outcome: Progressing 08/11/2023 1934 by Vincente Liberty, RN Outcome: Progressing   Problem: Safety: Goal: Ability to remain free from injury will improve 08/11/2023 1935 by Vincente Liberty, RN Outcome: Progressing 08/11/2023 1934 by Vincente Liberty, RN Outcome: Progressing   Problem: Skin Integrity: Goal: Risk for impaired skin integrity will decrease 08/11/2023 1935 by Vincente Liberty, RN Outcome: Progressing 08/11/2023 1934 by Vincente Liberty, RN Outcome: Progressing   Problem: Education: Goal: Ability to verbalize activity precautions or restrictions will improve 08/11/2023 1935 by Vincente Liberty, RN Outcome: Progressing 08/11/2023 1934 by Vincente Liberty, RN Outcome: Progressing Goal: Knowledge of the prescribed therapeutic regimen will improve 08/11/2023 1935 by Vincente Liberty, RN Outcome: Progressing 08/11/2023 1934 by Vincente Liberty, RN Outcome: Progressing Goal: Understanding of discharge needs will improve 08/11/2023 1935 by Vincente Liberty, RN Outcome: Progressing 08/11/2023 1934 by Vincente Liberty, RN Outcome: Progressing   Problem: Activity: Goal: Ability to avoid complications of mobility impairment will improve 08/11/2023 1935 by Vincente Liberty, RN Outcome: Progressing 08/11/2023 1934 by Vincente Liberty,  RN Outcome: Progressing Goal: Ability to tolerate increased activity will improve 08/11/2023 1935 by Vincente Liberty, RN Outcome: Progressing 08/11/2023 1934 by Vincente Liberty, RN Outcome: Progressing Goal: Will remain free from falls 08/11/2023 1935 by Vincente Liberty, RN Outcome: Progressing 08/11/2023 1934 by Vincente Liberty, RN Outcome: Progressing   Problem: Bowel/Gastric: Goal: Gastrointestinal status for postoperative course will improve 08/11/2023 1935 by Vincente Liberty, RN Outcome: Progressing 08/11/2023 1934 by Vincente Liberty, RN Outcome: Progressing   Problem: Clinical Measurements: Goal: Ability to maintain clinical measurements  within normal limits will improve 08/11/2023 1935 by Vincente Liberty, RN Outcome: Progressing 08/11/2023 1934 by Vincente Liberty, RN Outcome: Progressing Goal: Postoperative complications will be avoided or minimized 08/11/2023 1935 by Vincente Liberty, RN Outcome: Progressing 08/11/2023 1934 by Vincente Liberty, RN Outcome: Progressing Goal: Diagnostic test results will improve 08/11/2023 1935 by Vincente Liberty, RN Outcome: Progressing 08/11/2023 1934 by Vincente Liberty, RN Outcome: Progressing   Problem: Pain Management: Goal: Pain level will decrease 08/11/2023 1935 by Vincente Liberty, RN Outcome: Progressing 08/11/2023 1934 by Vincente Liberty, RN Outcome: Progressing   Problem: Skin Integrity: Goal: Will show signs of wound healing 08/11/2023 1935 by Vincente Liberty, RN Outcome: Progressing 08/11/2023 1934 by Vincente Liberty, RN Outcome: Progressing   Problem: Health Behavior/Discharge Planning: Goal: Identification of resources available to assist in meeting health care needs will improve 08/11/2023 1935 by Vincente Liberty, RN Outcome: Progressing 08/11/2023 1934 by Vincente Liberty, RN Outcome: Progressing   Problem: Bladder/Genitourinary: Goal: Urinary functional status for postoperative course  will improve 08/11/2023 1935 by Vincente Liberty, RN Outcome: Progressing 08/11/2023 1934 by Vincente Liberty, RN Outcome: Progressing

## 2023-08-11 NOTE — Transfer of Care (Signed)
Immediate Anesthesia Transfer of Care Note  Patient: [name]  Procedure(s) Performed: ANTERIOR CERVICAL DECOMPRESSION/DISCECTOMY FUSION 4 LEVELS Cervical Five-Six, Six-Seven, & Cervical Seven to Thoracic one  Patient Location: PACU  Anesthesia Type:General  Level of Consciousness: awake, alert , and oriented  Airway & Oxygen Therapy: Patient Spontanous Breathing  Post-op Assessment: Report given to RN and Post -op Vital signs reviewed and stable  Post vital signs: Reviewed and stable  Last Vitals:  Vitals Value Taken Time  BP 147/85 08/11/23 1330  Temp    Pulse 80 08/11/23 1331  Resp 18 08/11/23 1331  SpO2 90 % 08/11/23 1331  Vitals shown include unfiled device data.  Last Pain:  Vitals:   08/11/23 0621  TempSrc:   PainSc: 8       Patients Stated Pain Goal: 0 (08/11/23 7035)  Complications: No notable events documented.

## 2023-08-11 NOTE — H&P (Signed)
History:  [name] is a very pleasant 47 year old gentleman with significant neck and radicular arm pain. Despite conservative management his quality of life is continued to deteriorate. As result we have elected to move forward with a 3 level ACDF C5-T1.  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   Current Outpatient Medications on File Prior to Encounter  Medication Sig Dispense Refill   acetaminophen (TYLENOL) 650 MG CR tablet Take 1,300 mg by mouth every 8 (eight) hours as needed for pain.     naproxen sodium (ALEVE) 220 MG tablet Take 440 mg by mouth 2 (two) times daily as needed (pain).      Physical Exam: Vitals:   08/11/23 0601  BP: 134/74  Pulse: 60  Resp: 17  Temp: 97.9 F (36.6 C)  SpO2: 97%   Body mass index is 26.93 kg/m. Clinical exam: [name] is a pleasant individual, who appears younger than their stated age.  He is alert and orientated 3.  No shortness of breath, chest pain.  Abdomen is soft and non-tender, negative loss of bowel and bladder control, no rebound tenderness.  Negative: skin lesions abrasions contusions  Peripheral pulses: 2+ peripheral pulses bilaterally. LE compartments are: Soft and nontender.  Gait pattern: Normal  Assistive devices: None  Neuro: Positive decreased sensation to light touch in the left C6,7, and 8 dermatomes. Negative Hoffman test, Lhermitte sign. Positive Spurling sign with reproduction of left radicular arm pain. 5/5 motor strength in the right upper extremity. Patient has 4+/5 left  bicep/tricep/grip strength.  Musculoskeletal: significant neck pain radiating into the left upper extremity. Pain is intensified with range of motion testing and deep palpation especially on the left paraspinal/trapezius.  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 Lordosis is still maintained.  Cervical MRI: completed on 11/16/2022. No cord signal changes. Moderate to severe disc degeneration at C6-7 with moderate to severe biforaminal stenosis. Moderate left foraminal stenosis at C5-6 and moderate to severe left foraminal stenosis at C4-5.  Cervical MRI: completed on 07/04/2023. No cord signal changes left paracentral disc herniation at C5-6 causing left C6 nerve root compression. Left paracentral foraminal disc protrusion at C7-T1 resulting in moderate spinal stenosis and left C8 nerve compression. Circumferential disc osteophyte at C6-7 producing moderate central stenosis. Mild to moderate degenerative changes C3-5.   A/P: [name] is a very pleasant 47 year old gentleman who has had progressive debilitating neck and left radicular arm pain. Since his initial evaluation he has developed worsening left arm pain and now has dysesthesias and weakness. Repeat imaging shows progression of the degenerative disease and worsening of the neural compression C5-T1. The areas of principal concern are C5-6 and C7-T1. At this point time we have discussed repeat injections and ongoing physical therapy he is expressed a desire to move forward with surgery since these modalities provided no relief. At this point time since he is having progressive neurological deficits and radicular pain  and has failed conservative treatment I do think it is reasonable to move forward with surgery. To address the areas of pathology I have mended a 3 level ACDF C5-T1 I have gone over the surgical procedure with the patient and his wife including the  risks and benefits and all of their questions were addressed. Risks and benefits of surgery were discussed with the patient. These include: Infection, bleeding, death, stroke, paralysis, ongoing or worse pain, need for additional surgery, nonunion, leak of spinal fluid, adjacent segment degeneration requiring additional fusion surgery. Pseudoarthrosis (nonunion)requiring supplemental posterior fixation. Throat pain, swallowing difficulties, hoarseness or change in voice.

## 2023-08-11 NOTE — Anesthesia Procedure Notes (Signed)
Procedure Name: Intubation Date/Time: 08/11/2023 8:10 AM  Performed by: Sandie Ano, CRNAPre-anesthesia Checklist: Patient identified, Emergency Drugs available, Suction available and Patient being monitored Patient Re-evaluated:Patient Re-evaluated prior to induction Oxygen Delivery Method: Circle System Utilized Preoxygenation: Pre-oxygenation with 100% oxygen Induction Type: IV induction Ventilation: Mask ventilation without difficulty Laryngoscope Size: Glidescope and 4 Grade View: Grade I Tube type: Oral Tube size: 7.5 mm Number of attempts: 1 Airway Equipment and Method: Stylet and Oral airway Placement Confirmation: ETT inserted through vocal cords under direct vision, positive ETCO2 and breath sounds checked- equal and bilateral Secured at: 22 cm Tube secured with: Tape Dental Injury: Teeth and Oropharynx as per pre-operative assessment

## 2023-08-12 DIAGNOSIS — M4723 Other spondylosis with radiculopathy, cervicothoracic region: Secondary | ICD-10-CM | POA: Diagnosis not present

## 2023-08-12 DIAGNOSIS — I10 Essential (primary) hypertension: Secondary | ICD-10-CM | POA: Diagnosis not present

## 2023-08-12 DIAGNOSIS — Z79899 Other long term (current) drug therapy: Secondary | ICD-10-CM | POA: Diagnosis not present

## 2023-08-12 MED ORDER — MELATONIN 3 MG PO TABS: 3.0000 mg | ORAL_TABLET | Freq: Every day | ORAL | Status: DC

## 2023-08-12 MED ORDER — MELATONIN 3 MG PO TABS
3.0000 mg | ORAL_TABLET | Freq: Every evening | ORAL | Status: DC | PRN
  Administered 2023-08-12: 3 mg via ORAL
  Filled 2023-08-12: qty 1

## 2023-08-12 NOTE — TOC Transition Note (Signed)
Transition of Care [facility]) - Discharge Note   Patient Details  Name: [name] MRN: [id number] Date of Birth: 01-Jan-1976  Transition of Care [facility] CM/SW Contact:  Gordy Clement, RN Phone Number: 08/12/2023, 8:25 AM   Clinical Narrative:    Patient to DC to home today No TOC needs identified Family to transport           Patient Goals and CMS Choice            Discharge Placement                       Discharge Plan and Services Additional resources added to the After Visit Summary for                                       Social Drivers of Health (SDOH) Interventions SDOH Screenings   Tobacco Use: Medium Risk (08/11/2023)     Readmission Risk Interventions     No data to display

## 2023-08-12 NOTE — Progress Notes (Signed)
 Patient alert and oriented, voiding adequately, skin clean, dry and intact without evidence of skin break down, or symptoms of complications - no redness or edema noted, only slight tenderness at site.  Patient states pain is manageable at time of discharge. Room was checked and accounted for all patient's belongings; discharge instructions concerning her medications, incision care, follow up appointment and when to call the doctor as needed were all discussed with patient by RN and he expressed understanding on the instructions given.

## 2023-08-12 NOTE — Discharge Summary (Signed)
Patient ID: [name] MRN: [id number] DOB/AGE: September 22, 1975 47 y.o.  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 male who was admitted 08/11/2023 for operative treatment of Fusion of spine, cervical region. Patient failed conservative treatments (please see the history and physical for the specifics) and had severe unremitting pain that affects sleep, daily activities and work/hobbies. After pre-op clearance, the patient was taken to the operating room on 08/11/2023 and underwent  Procedure(s): ANTERIOR CERVICAL DECOMPRESSION/DISCECTOMY FUSION 4 LEVELS Cervical Five-Six, Six-Seven, & Cervical Seven to Thoracic one.    Patient was given perioperative antibiotics:  Anti-infectives (From admission, onward)    Start     Dose/Rate Route Frequency Ordered Stop   08/11/23 2000  ceFAZolin (ANCEF) IVPB 1 g/50 mL premix        1 g 100 mL/hr over 30 Minutes Intravenous Every 8 hours 08/11/23 1508 08/12/23 0452   08/11/23 0550  ceFAZolin (ANCEF) IVPB 2g/100 mL premix        2 g 200 mL/hr over 30 Minutes Intravenous 30 min pre-op  08/11/23 0550 08/11/23 0815        Patient was given sequential compression devices and early ambulation to prevent DVT.   Patient benefited maximally from hospital stay and there were no complications. At the time of discharge, the patient was urinating/moving their bowels without difficulty, tolerating a regular diet, pain is controlled with oral pain medications and they have been cleared by PT/OT.   Recent vital signs: Patient Vitals for the past 24 hrs:  BP Temp Temp src Pulse Resp SpO2  08/12/23 0416 (!) 162/95 98.4 F (36.9 C) Oral 69 20 93 %  08/11/23 2335 (!) 184/95 98.1 F (36.7 C) Oral 73 20 100 %  08/11/23 2033 (!) 174/96 98.7 F (37.1 C) Oral 71 20 98 %  08/11/23 1527 (!) 150/85 98 F (36.7 C) -- (!) 59 18 96 %  08/11/23 1500 (!) 147/78 98.9 F (37.2 C) -- 71 16 94 %  08/11/23 1445 (!) 151/88 -- -- 76 15 95 %  08/11/23 1430 (!) 152/85 -- -- 70 13 99 %  08/11/23 1415 -- -- -- 75 10 99 %  08/11/23 1400 (!) 159/93 -- -- 90 (!) 37 100 %  08/11/23 1345 (!) 144/87 -- -- 84 17 97 %  08/11/23 1330 (!) 147/85 -- -- 85 20 95 %  08/11/23 1325 138/80 99.1 F (37.3 C) -- 85 16 96 %     Recent laboratory studies: No results for input(s): "WBC", "HGB", "HCT", "PLT", "NA", "K", "CL", "CO2", "BUN", "CREATININE", "GLUCOSE", "INR", "CALCIUM" in the last 72 hours.  Invalid input(s): "PT", "2"  Discharge Medications:   Allergies as of 08/12/2023       Reactions   Codeine Itching   Penicillins Other (See Comments)   "hight temp"   Pork-derived Products Other (See Comments)   RELIGIOUS        Medication List     STOP taking these medications    acetaminophen 650 MG CR tablet Commonly known as: TYLENOL   naproxen sodium 220 MG tablet Commonly known as: ALEVE       TAKE these medications    methocarbamol 500 MG tablet Commonly known as: ROBAXIN Take 1 tablet (500 mg total) by mouth every 8 (eight) hours as needed for up to 5 days for muscle spasms.    ondansetron 4 MG tablet Commonly known as: Zofran Take 1 tablet (4 mg total) by mouth every 8 (eight) hours as needed for nausea or vomiting.   oxyCODONE-acetaminophen 10-325 MG tablet Commonly known as: Percocet Take 1 tablet by mouth every 6 (six) hours as needed for up to 5 days for pain.        Diagnostic Studies: DG Cervical Spine 2 or 3 views Result Date: 08/11/2023 CLINICAL DATA:  Elective surgery. Anterior cervical decompression and discectomy fusion 3 levels. EXAM: CERVICAL SPINE - 2-3 VIEW COMPARISON:  Cervical spine radiographs 11/11/2022. MRI cervical spine 07/04/2023 FINDINGS: Intraoperative fluoroscopy is utilized for surgical control purposes. Fluoroscopy time is recorded at 2 minutes 2 seconds. Dose 30 mGy. Four spot fluoroscopic images are provided. Spot fluoroscopic images demonstrate anterior fixation and intervertebral fusion hardware at C5-6, C6-7, and C7-T1 levels. Alignment appears normal. IMPRESSION: Intraoperative fluoroscopy is utilized for surgical control purposes. Anterior fixation and intervertebral fusion from C5 through T1. Electronically Signed   By: Burman Nieves M.D.   On: 08/11/2023 17:03   DG C-Arm 1-60 Min-No Report Result Date: 08/11/2023 Fluoroscopy was utilized by the requesting physician.  No radiographic interpretation.   DG C-Arm 1-60 Min-No Report Result Date: 08/11/2023 Fluoroscopy was utilized by the requesting physician.  No radiographic interpretation.   DG C-Arm 1-60 Min-No Report Result Date: 08/11/2023 Fluoroscopy was utilized by the requesting physician.  No radiographic interpretation.   DG C-Arm 1-60 Min-No Report Result Date: 08/11/2023 Fluoroscopy was utilized by the requesting physician.  No radiographic interpretation.   DG C-Arm 1-60 Min-No Report Result Date: 08/11/2023 Fluoroscopy was utilized by the requesting physician.  No radiographic interpretation.    Discharge Instructions     Incentive spirometry RT    Complete by: As directed         Follow-up Information     Venita Lick, MD. Schedule an appointment as soon as possible for a visit in 2 week(s).   Specialty: Orthopedic Surgery Why: If symptoms worsen, For suture removal, For wound re-check Contact information: [address] [place] [address] [phone]                 Discharge Plan:  discharge to home.   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  There were no complications during surgery and his postoperative course has been benign.  The patient is tolerating a regular diet, voiding spontaneously, and has had a positive bowel movement.  His pain is well-controlled with oral medications.  Patient primarily complains of incisional pain and mild dysphagia.  His radicular left arm pain has significantly improved.  He is neurologically intact with no signs of radiculopathy or myelopathy.  Will plan on discharge  to home.  All appropriate medications and instructions have been provided.  He will follow-up with me in 2 weeks.    Signed: Alvy Beal for Dr. Venita Lick [facility] [phone] 08/12/2023, 8:03 AM

## 2023-08-12 NOTE — Evaluation (Signed)
Physical Therapy Evaluation & Discharge Patient Details Name: [name] MRN: [id number] DOB: 1976-04-15 Today's Date: 08/12/2023  History of Present Illness  Pt is a 47 y.o. male who presented 08/11/23 for elective ACDF C5-T1. PMH: ADHD, arthritis, GERD, HTN, tuberculosis   Clinical Impression  Pt presents with condition above. At this time, the pt is reporting that the symptoms that were present prior to surgery have resolved after surgery except noted some mild numbness was still present at his L 5th finger. He demonstrated gross MMT scores of 5/5 in his bil upper extremities today. Pt is currently functioning at his baseline, not needing any assistance for ADLs or functional mobility, including navigating stairs. Reviewed his cervical precautions, how to adjust his cervical brace, cervical brace wearing schedule, and how to maintain his precautions while mobilizing and performing ADLs. Pt and his wife verbalized understanding. All education completed and questions answered. No further PT needs identified. Will sign off.    If plan is discharge home, recommend the following: Assistance with cooking/housework;Assist for transportation (to maintain cervical precautions and while on pain meds)   Can travel by private vehicle        Equipment Recommendations None recommended by PT  Recommendations for Other Services       Functional Status Assessment Patient has not had a recent decline in their functional status     Precautions / Restrictions Precautions Precautions: Cervical Precaution Booklet Issued: Yes (comment) Precaution Comments: reviewed precautions Required Braces or Orthoses: Cervical Brace Cervical Brace: Hard collar;Other (comment) (Apply in sitting; May remove when in bed, to ambulate to bathroom, and to shower) Restrictions Weight Bearing Restrictions Per Provider Order: No      Mobility  Bed Mobility               General bed mobility comments: Pt  standing in room at start and end of session    Transfers Overall transfer level: Independent Equipment used: None               General transfer comment: No assistance needed, no LOB    Ambulation/Gait Ambulation/Gait assistance: Independent Gait Distance (Feet): 280 Feet Assistive device: None Gait Pattern/deviations: WFL(Within Functional Limits) Gait velocity: WFL Gait velocity interpretation: >4.37 ft/sec, indicative of normal walking speed   General Gait Details: No significant gait deviations noted. No LOB  Stairs Stairs: Yes Stairs assistance: Independent Stair Management: No rails, Alternating pattern, Forwards Number of Stairs: 10 General stair comments: Ascends and descends stairs quickly without LOB  Wheelchair Mobility     Tilt Bed    Modified Rankin (Stroke Patients Only)       Balance Overall balance assessment: No apparent balance deficits (not formally assessed)                                           Pertinent Vitals/Pain Pain Assessment Pain Assessment: Faces Faces Pain Scale: Hurts a little bit Pain Location: neck Pain Descriptors / Indicators: Discomfort, Operative site guarding Pain Intervention(s): Limited activity within patient's tolerance, Monitored during session, Repositioned    Home Living Family/patient expects to be discharged to:: Private residence Living Arrangements: Spouse/significant other;Children Available Help at Discharge: Family;Available 24 hours/day Type of Home: House Home Access: Stairs to enter Entrance Stairs-Rails: Can reach both Entrance Stairs-Number of Steps: 3   Home Layout: One level        Prior Function  Prior Level of Function : Independent/Modified Independent                     Extremity/Trunk Assessment   Upper Extremity Assessment Upper Extremity Assessment: LUE deficits/detail LUE Deficits / Details: pt reporting symptoms that were present prior to  surgery have resolved after surgery except noted some mild numbness still present at his 5th finger; gross MMT scores of 5 bil LUE Sensation: decreased light touch (at 5th finger) LUE Coordination: WNL    Lower Extremity Assessment Lower Extremity Assessment: Overall WFL for tasks assessed    Cervical / Trunk Assessment Cervical / Trunk Assessment: Neck Surgery  Communication   Communication Communication: No apparent difficulties  Cognition Arousal: Alert Behavior During Therapy: WFL for tasks assessed/performed Overall Cognitive Status: Within Functional Limits for tasks assessed                                 General Comments: Pt eager to d/c home        General Comments General comments (skin integrity, edema, etc.): educated pt and wife on maintaining his cervical precautions with mobility and ADLs; reviewed how to adjust c-collar and wearing schedule; they verbalized understanding    Exercises     Assessment/Plan    PT Assessment Patient does not need any further PT services  PT Problem List         PT Treatment Interventions      PT Goals (Current goals can be found in the Care Plan section)  Acute Rehab PT Goals Patient Stated Goal: to go home PT Goal Formulation: All assessment and education complete, DC therapy Time For Goal Achievement: 08/13/23 Potential to Achieve Goals: Good    Frequency       Co-evaluation               AM-PAC PT "6 Clicks" Mobility  Outcome Measure Help needed turning from your back to your side while in a flat bed without using bedrails?: None Help needed moving from lying on your back to sitting on the side of a flat bed without using bedrails?: None Help needed moving to and from a bed to a chair (including a wheelchair)?: None Help needed standing up from a chair using your arms (e.g., wheelchair or bedside chair)?: None Help needed to walk in hospital room?: None Help needed climbing 3-5 steps with a  railing? : None 6 Click Score: 24    End of Session Equipment Utilized During Treatment: Cervical collar Activity Tolerance: Patient tolerated treatment well Patient left: with family/visitor present;Other (comment) (standing in room) Nurse Communication: Mobility status PT Visit Diagnosis: Other symptoms and signs involving the nervous system (R29.898)    Time: 1610-9604 PT Time Calculation (min) (ACUTE ONLY): 10 min   Charges:   PT Evaluation $PT Eval Low Complexity: 1 Low   PT General Charges $$ ACUTE PT VISIT: 1 Visit         Virgil Benedict, PT, DPT Acute Rehabilitation Services  Office: [phone]   Bettina Gavia 08/12/2023, 7:55 AM

## 2023-08-12 NOTE — Progress Notes (Signed)
OT Screen Note  Patient Details Name: [name] MRN: [id number] DOB: 1976/03/02   Screened Treatment:    Reason Eval/Treat Not Completed: OT screened, no needs identified, will sign off (Discussed with PT, pt independent with post op mobility and ADLs. They also report improvement with LUE symptoms, some mild numbness along the L pinky finger still lingering. OT signing off, reconsult if needed)  08/12/2023  AB, OTR/L  Acute Rehabilitation Services  Office: [phone]   Tristan Schroeder 08/12/2023, 7:56 AM

## 2023-08-14 ENCOUNTER — Encounter: Payer: Self-pay | Admitting: Nurse Practitioner

## 2023-08-14 ENCOUNTER — Ambulatory Visit: Payer: Self-pay | Admitting: Nurse Practitioner

## 2023-08-14 VITALS — BP 138/76 | HR 75 | Temp 97.9°F | Resp 14 | Ht 69.25 in | Wt 187.3 lb

## 2023-08-14 DIAGNOSIS — Z130 Encounter for screening for diseases of the blood and blood-forming organs and certain disorders involving the immune mechanism: Secondary | ICD-10-CM

## 2023-08-14 DIAGNOSIS — F331 Major depressive disorder, recurrent, moderate: Secondary | ICD-10-CM | POA: Diagnosis not present

## 2023-08-14 DIAGNOSIS — Z114 Encounter for screening for human immunodeficiency virus [HIV]: Secondary | ICD-10-CM

## 2023-08-14 DIAGNOSIS — F902 Attention-deficit hyperactivity disorder, combined type: Secondary | ICD-10-CM

## 2023-08-14 DIAGNOSIS — I10 Essential (primary) hypertension: Secondary | ICD-10-CM | POA: Diagnosis not present

## 2023-08-14 DIAGNOSIS — Z1159 Encounter for screening for other viral diseases: Secondary | ICD-10-CM | POA: Diagnosis not present

## 2023-08-14 DIAGNOSIS — E782 Mixed hyperlipidemia: Secondary | ICD-10-CM | POA: Diagnosis not present

## 2023-08-14 DIAGNOSIS — R7303 Prediabetes: Secondary | ICD-10-CM

## 2023-08-14 DIAGNOSIS — F419 Anxiety disorder, unspecified: Secondary | ICD-10-CM

## 2023-08-14 DIAGNOSIS — M4322 Fusion of spine, cervical region: Secondary | ICD-10-CM

## 2023-08-14 DIAGNOSIS — F322 Major depressive disorder, single episode, severe without psychotic features: Secondary | ICD-10-CM

## 2023-08-14 MED ORDER — HYDROCHLOROTHIAZIDE 12.5 MG PO TABS: 12.5000 mg | ORAL_TABLET | Freq: Every day | ORAL | 0 refills | Status: AC

## 2023-08-14 NOTE — Progress Notes (Signed)
BP 138/76 (BP Location: Left Arm, Patient Position: Sitting, Cuff Size: Normal)   Pulse 75   Temp 97.9 F (36.6 C) (Oral)   Resp 14   Ht 5' 9.25" (1.759 m) Comment: per patient  Wt 187 lb 4.8 oz (85 kg)   SpO2 97%   BMI 27.46 kg/m    Subjective:    Patient ID: [name], male    DOB: October 30, 1975, 47 y.o.   MRN: [id number]  HPI: [name] is a 47 y.o. male  Chief Complaint  Patient presents with   Establish Care    Discussed the use of AI scribe software for clinical note transcription with the patient, who gave verbal consent to proceed.  History of Present Illness   The patient, with a history of hypertension, depression, and ADHD, recently underwent a three-level ACDF for cervical spondylitic radiculopathy. He reports persistent pain following the surgery, despite being prescribed Robaxin, oxycodone-acetaminophen, and tramadol. The patient also reports a history of high blood pressure and was previously on lisinopril, which he discontinued due to a persistent cough. He also has a history of ADHD and has been off medication for almost a year. The patient also reports a history of depression, with an elevated PHQ-9 score. He describes feeling down and stressed, with disrupted sleep and appetite. He has previously been on Wellbutrin but does not recall if it was helpful.       08/14/2023    8:51 AM  Depression screen PHQ 2/9  Decreased Interest 3  Down, Depressed, Hopeless 3  PHQ - 2 Score 6  Altered sleeping 3  Tired, decreased energy 3  Change in appetite 0  Feeling bad or failure about yourself  3  Trouble concentrating 3  Moving slowly or fidgety/restless 3  Suicidal thoughts 0  PHQ-9 Score 21  Difficult doing work/chores Somewhat difficult       08/14/2023    8:53 AM  GAD 7 : Generalized Anxiety Score  Nervous, Anxious, on Edge 3  Control/stop worrying 3  Worry too much - different things 3  Trouble relaxing 3  Restless 3  Easily annoyed or  irritable 3  Afraid - awful might happen 3  Total GAD 7 Score 21  Anxiety Difficulty Somewhat difficult     Relevant past medical, surgical, family and social history reviewed and updated as indicated. Interim medical history since our last visit reviewed. Allergies and medications reviewed and updated.  Review of Systems  Constitutional: Negative for fever or weight change.  Respiratory: Negative for cough and shortness of breath.   Cardiovascular: Negative for chest pain or palpitations.  Gastrointestinal: Negative for abdominal pain, no bowel changes.  Musculoskeletal: Negative for gait problem or joint swelling.  Skin: Negative for rash.  Neurological: Negative for dizziness or headache.  No other specific complaints in a complete review of systems (except as listed in HPI above).      Objective:    BP 138/76 (BP Location: Left Arm, Patient Position: Sitting, Cuff Size: Normal)   Pulse 75   Temp 97.9 F (36.6 C) (Oral)   Resp 14   Ht 5' 9.25" (1.759 m) Comment: per patient  Wt 187 lb 4.8 oz (85 kg)   SpO2 97%   BMI 27.46 kg/m    Wt Readings from Last 3 Encounters:  08/14/23 187 lb 4.8 oz (85 kg)  08/11/23 185 lb (83.9 kg)  08/06/23 182 lb 12.8 oz (82.9 kg)    Physical Exam  Constitutional: Patient  appears well-developed and well-nourished.  No distress.  HEENT: head atraumatic, normocephalic, pupils equal and reactive to light, neck supple, currently in c-collar Cardiovascular: Normal rate, regular rhythm and normal heart sounds.  No murmur heard. No BLE edema. Pulmonary/Chest: Effort normal and breath sounds normal. No respiratory distress. Abdominal: Soft.  There is no tenderness. Psychiatric: Patient has a normal mood and affect. behavior is normal. Judgment and thought content normal.  Results for orders placed or performed during the hospital encounter of 08/06/23  Surgical pcr screen   Collection Time: 08/06/23  8:59 AM   Specimen: Nasal Mucosa; Nasal Swab   Result Value Ref Range   MRSA, PCR NEGATIVE NEGATIVE   Staphylococcus aureus NEGATIVE NEGATIVE  Basic metabolic panel per protocol   Collection Time: 08/06/23  9:30 AM  Result Value Ref Range   Sodium 136 135 - 145 mmol/L   Potassium 3.8 3.5 - 5.1 mmol/L   Chloride 103 98 - 111 mmol/L   CO2 23 22 - 32 mmol/L   Glucose, Bld 100 (H) 70 - 99 mg/dL   BUN 8 6 - 20 mg/dL   Creatinine, Ser 1.61 0.61 - 1.24 mg/dL   Calcium 8.7 (L) 8.9 - 10.3 mg/dL   GFR, Estimated >09 >60 mL/min   Anion gap 10 5 - 15  CBC per protocol   Collection Time: 08/06/23  9:30 AM  Result Value Ref Range   WBC 10.8 (H) 4.0 - 10.5 K/uL   RBC 5.16 4.22 - 5.81 MIL/uL   Hemoglobin 14.3 13.0 - 17.0 g/dL   HCT 45.4 09.8 - 11.9 %   MCV 84.9 80.0 - 100.0 fL   MCH 27.7 26.0 - 34.0 pg   MCHC 32.6 30.0 - 36.0 g/dL   RDW 14.7 82.9 - 56.2 %   Platelets 257 150 - 400 K/uL   nRBC 0.0 0.0 - 0.2 %  No blood products   Collection Time: 08/06/23  9:50 AM  Result Value Ref Range   Transfuse no blood products      TRANSFUSE NO BLOOD PRODUCTS, VERIFIED BY LISA HODGIN RN Performed at [facility], [address] [place], [place] [address]        Assessment & Plan:   Problem List Items Addressed This Visit       Cardiovascular and Mediastinum   Hypertension   Relevant Medications   hydrochlorothiazide (HYDRODIURIL) 12.5 MG tablet     Musculoskeletal and Integument   Fusion of spine, cervical region     Other   ADHD (attention deficit hyperactivity disorder), combined type   Relevant Orders   Ambulatory referral to Psychology   Anxiety   Mixed hyperlipidemia   Relevant Medications   hydrochlorothiazide (HYDRODIURIL) 12.5 MG tablet   Other Relevant Orders   Lipid panel   Prediabetes   Relevant Orders   COMPLETE METABOLIC PANEL WITH GFR   Hemoglobin A1c   Severe major depressive disorder (HCC) - Primary   Other Visit Diagnoses       Moderate episode of recurrent major depressive disorder (HCC)          Screening for deficiency anemia       Relevant Orders   CBC with Differential/Platelet     Screening for HIV without presence of risk factors       Relevant Orders   HIV Antibody (routine testing w rflx)     Encounter for hepatitis C screening test for low risk patient       Relevant Orders  Hepatitis C antibody        Assessment and Plan    Post-operative Cervical Spine Fusion Recent 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 Currently on Robaxin 500mg  and Oxycodone-Acetaminophen 10-325mg  every 6 hours.  -Follow up with Dr. Shon Baton in 2 weeks.  Hypertension History of hypertension, not currently on medication. BP 138/76 today. Lisinopril previously caused cough. Tolerated HCTZ well in the past. -Start Hydrochlorothiazide (HCTZ). -Check BP at home.  Depression History of depression, currently experiencing increased stress and low mood. Previously on Wellbutrin. -declined treatment at this time -Monitor mood and consider medication if symptoms worsen.  ADHD History of ADHD, not currently on medication. Reports difficulty focusing and completing tasks. -Refer for ADHD testing.  Hyperlipidemia History of high cholesterol, not recently checked. -Order lipid panel.  General Health Maintenance -Encourage consistent medication use. -Follow up on lab results.        Follow up plan: Return in about 6 months (around 02/12/2024) for follow up.

## 2023-08-14 NOTE — Anesthesia Postprocedure Evaluation (Signed)
Anesthesia Post Note  Patient: Devery L Gotcher  Procedure(s) Performed: ANTERIOR CERVICAL DECOMPRESSION/DISCECTOMY FUSION 4 LEVELS Cervical Five-Six, Six-Seven, & Cervical Seven to Thoracic one     Patient location during evaluation: PACU Anesthesia Type: General Level of consciousness: awake and alert Pain management: pain level controlled Vital Signs Assessment: post-procedure vital signs reviewed and stable Respiratory status: spontaneous breathing, nonlabored ventilation, respiratory function stable and patient connected to nasal cannula oxygen Cardiovascular status: blood pressure returned to baseline and stable Postop Assessment: no apparent nausea or vomiting Anesthetic complications: no  No notable events documented.  Last Vitals:  Vitals:   08/12/23 0416 08/12/23 0807  BP: (!) 162/95 (!) 170/97  Pulse: 69 78  Resp: 20 18  Temp: 36.9 C 36.6 C  SpO2: 93% 95%    Last Pain:  Vitals:   08/12/23 0807  TempSrc: Oral  PainSc:    Pain Goal: Patients Stated Pain Goal: 4 (08/11/23 1727)                 Jeniel Slauson L Desirre Eickhoff

## 2023-08-14 NOTE — Patient Instructions (Signed)
[facility] Address: [address] [place], [place], [place] [address] Open ? Closes 5?PM Boxing Day might affect these hours Phone: [phone]

## 2023-08-15 ENCOUNTER — Encounter (HOSPITAL_COMMUNITY): Payer: Self-pay | Admitting: Orthopedic Surgery

## 2023-08-15 LAB — COMPLETE METABOLIC PANEL WITH GFR
AG Ratio: 1.7 (calc) (ref 1.0–2.5)
ALT: 12 U/L (ref 9–46)
AST: 15 U/L (ref 10–40)
Albumin: 4.2 g/dL (ref 3.6–5.1)
Alkaline phosphatase (APISO): 67 U/L (ref 36–130)
BUN: 11 mg/dL (ref 7–25)
CO2: 32 mmol/L (ref 20–32)
Calcium: 8.9 mg/dL (ref 8.6–10.3)
Chloride: 98 mmol/L (ref 98–110)
Creat: 0.72 mg/dL (ref 0.60–1.29)
Globulin: 2.5 g/dL (ref 1.9–3.7)
Glucose, Bld: 96 mg/dL (ref 65–99)
Potassium: 4.2 mmol/L (ref 3.5–5.3)
Sodium: 139 mmol/L (ref 135–146)
Total Bilirubin: 0.3 mg/dL (ref 0.2–1.2)
Total Protein: 6.7 g/dL (ref 6.1–8.1)
eGFR: 114 mL/min/{1.73_m2} (ref >=60)

## 2023-08-15 LAB — LIPID PANEL
Cholesterol: 151 mg/dL (ref <200)
HDL: 36 mg/dL — ABNORMAL LOW (ref >=40)
LDL Cholesterol (Calc): 83 mg/dL
Non-HDL Cholesterol (Calc): 115 mg/dL (ref <130)
Total CHOL/HDL Ratio: 4.2 (calc) (ref <5.0)
Triglycerides: 220 mg/dL — ABNORMAL HIGH (ref <150)

## 2023-08-15 LAB — CBC WITH DIFFERENTIAL/PLATELET
Absolute Lymphocytes: 3430 {cells}/uL (ref 850–3900)
Absolute Monocytes: 1288 {cells}/uL — ABNORMAL HIGH (ref 200–950)
Basophils Absolute: 41 {cells}/uL (ref 0–200)
Basophils Relative: 0.4 %
Eosinophils Absolute: 175 {cells}/uL (ref 15–500)
Eosinophils Relative: 1.7 %
HCT: 41.3 % (ref 38.5–50.0)
Hemoglobin: 13.4 g/dL (ref 13.2–17.1)
MCH: 27.5 pg (ref 27.0–33.0)
MCHC: 32.4 g/dL (ref 32.0–36.0)
MCV: 84.8 fL (ref 80.0–100.0)
MPV: 11.9 fL (ref 7.5–12.5)
Monocytes Relative: 12.5 %
Neutro Abs: 5366 {cells}/uL (ref 1500–7800)
Neutrophils Relative %: 52.1 %
Platelets: 254 10*3/uL (ref 140–400)
RBC: 4.87 10*6/uL (ref 4.20–5.80)
RDW: 13.1 % (ref 11.0–15.0)
Total Lymphocyte: 33.3 %
WBC: 10.3 10*3/uL (ref 3.8–10.8)

## 2023-08-15 LAB — HEMOGLOBIN A1C
Hgb A1c MFr Bld: 6.1 %{Hb} — ABNORMAL HIGH (ref <5.7)
Mean Plasma Glucose: 128 mg/dL
eAG (mmol/L): 7.1 mmol/L

## 2023-08-15 LAB — HEPATITIS C ANTIBODY: Hepatitis C Ab: NONREACTIVE

## 2023-08-15 LAB — HIV ANTIBODY (ROUTINE TESTING W REFLEX): HIV 1&2 Ab, 4th Generation: NONREACTIVE

## 2023-08-17 ENCOUNTER — Encounter (HOSPITAL_COMMUNITY): Payer: Self-pay | Admitting: Emergency Medicine

## 2023-08-17 ENCOUNTER — Emergency Department (HOSPITAL_COMMUNITY)
Admission: EM | Admit: 2023-08-17 | Discharge: 2023-08-17 | Discharge status: Against Medical Advice | Payer: BC Managed Care – PPO | Attending: Medical | Admitting: Medical

## 2023-08-17 ENCOUNTER — Other Ambulatory Visit: Payer: Self-pay

## 2023-08-17 DIAGNOSIS — M542 Cervicalgia: Secondary | ICD-10-CM | POA: Diagnosis not present

## 2023-08-17 DIAGNOSIS — Z5321 Procedure and treatment not carried out due to patient leaving prior to being seen by health care provider: Secondary | ICD-10-CM | POA: Insufficient documentation

## 2023-08-17 DIAGNOSIS — Z760 Encounter for issue of repeat prescription: Secondary | ICD-10-CM | POA: Insufficient documentation

## 2023-08-17 MED ORDER — OXYCODONE-ACETAMINOPHEN 5-325 MG PO TABS: 1.0000 | ORAL_TABLET | Freq: Once | ORAL | Status: DC

## 2023-08-17 NOTE — ED Triage Notes (Signed)
Request of refill for percocet

## 2023-08-17 NOTE — ED Provider Triage Note (Cosign Needed)
Emergency Medicine Provider Triage Evaluation Note  MORI HUSEIN , a 47 y.o. male  was evaluated in triage.  Pt complains of discectomy by Dr. Shon Baton on 12/23. Was only give 20 oxycodone and 15 muscle relaxers and is in significant pain. No out of medicine.  Review of Systems  Positive: Neck pain Negative: fevers  Physical Exam  BP (!) 146/90 (BP Location: Right Arm)   Pulse 81   Temp 99.1 F (37.3 C)   Resp (!) 22   SpO2 97%  Gen:   Awake, no distress   Resp:  Normal effort  MSK:   Moves extremities without difficulty  Medical Decision Making  Medically screening exam initiated at 5:13 PM.  Appropriate orders placed.  Oronde L Mohamad was informed that the remainder of the evaluation will be completed by another provider, this initial triage assessment does not replace that evaluation, and the importance of remaining in the ED until their evaluation is complete.   Pete Pelt, [place] 08/17/23 1715

## 2023-08-26 DIAGNOSIS — M542 Cervicalgia: Secondary | ICD-10-CM | POA: Diagnosis not present

## 2023-09-23 DIAGNOSIS — Z4889 Encounter for other specified surgical aftercare: Secondary | ICD-10-CM | POA: Diagnosis not present

## 2023-09-26 DIAGNOSIS — M542 Cervicalgia: Secondary | ICD-10-CM | POA: Diagnosis not present

## 2023-10-02 DIAGNOSIS — M542 Cervicalgia: Secondary | ICD-10-CM | POA: Diagnosis not present

## 2023-10-08 DIAGNOSIS — M542 Cervicalgia: Secondary | ICD-10-CM | POA: Diagnosis not present

## 2023-10-15 DIAGNOSIS — M542 Cervicalgia: Secondary | ICD-10-CM | POA: Diagnosis not present

## 2023-10-27 DIAGNOSIS — M542 Cervicalgia: Secondary | ICD-10-CM | POA: Diagnosis not present

## 2023-11-05 DIAGNOSIS — M542 Cervicalgia: Secondary | ICD-10-CM | POA: Diagnosis not present

## 2023-11-11 DIAGNOSIS — Z4889 Encounter for other specified surgical aftercare: Secondary | ICD-10-CM | POA: Diagnosis not present

## 2024-02-12 ENCOUNTER — Ambulatory Visit: Payer: Self-pay | Admitting: Nurse Practitioner
